# Patient Record
Sex: Female | Born: 1945 | Race: White | Hispanic: No | Smoking: Never smoker
Health system: Southern US, Community
[De-identification: ages and names within clinical notes are randomized; demographics above are authoritative.]

## PROBLEM LIST (undated history)

## (undated) DIAGNOSIS — E785 Hyperlipidemia, unspecified: Secondary | ICD-10-CM

## (undated) DIAGNOSIS — E119 Type 2 diabetes mellitus without complications: Secondary | ICD-10-CM

## (undated) DIAGNOSIS — C801 Malignant (primary) neoplasm, unspecified: Secondary | ICD-10-CM

## (undated) DIAGNOSIS — R0600 Dyspnea, unspecified: Secondary | ICD-10-CM

## (undated) DIAGNOSIS — I10 Essential (primary) hypertension: Secondary | ICD-10-CM

## (undated) DIAGNOSIS — M1990 Unspecified osteoarthritis, unspecified site: Secondary | ICD-10-CM

## (undated) DIAGNOSIS — G4733 Obstructive sleep apnea (adult) (pediatric): Secondary | ICD-10-CM

## (undated) HISTORY — PX: OTHER SURGICAL HISTORY: SHX169

## (undated) HISTORY — PX: ABDOMINAL HYSTERECTOMY: SHX81

## (undated) HISTORY — DX: Essential (primary) hypertension: I10

## (undated) HISTORY — DX: Dyspnea, unspecified: R06.00

## (undated) HISTORY — DX: Obstructive sleep apnea (adult) (pediatric): G47.33

## (undated) HISTORY — DX: Hyperlipidemia, unspecified: E78.5

## (undated) HISTORY — PX: BACK SURGERY: SHX140

## (undated) HISTORY — PX: COLONOSCOPY: SHX174

## (undated) HISTORY — DX: Type 2 diabetes mellitus without complications: E11.9

## (undated) HISTORY — PX: APPENDECTOMY: SHX54

---

## 1999-07-16 ENCOUNTER — Encounter: Admission: RE | Admit: 1999-07-16 | Discharge: 1999-07-16 | Payer: Self-pay | Admitting: General Surgery

## 1999-07-16 ENCOUNTER — Encounter: Payer: Self-pay | Admitting: General Surgery

## 1999-08-21 ENCOUNTER — Emergency Department (HOSPITAL_COMMUNITY): Admission: EM | Admit: 1999-08-21 | Discharge: 1999-08-21 | Payer: Self-pay | Admitting: Emergency Medicine

## 1999-08-21 ENCOUNTER — Encounter: Payer: Self-pay | Admitting: Emergency Medicine

## 2000-05-31 ENCOUNTER — Emergency Department (HOSPITAL_COMMUNITY): Admission: EM | Admit: 2000-05-31 | Discharge: 2000-05-31 | Payer: Self-pay | Admitting: Emergency Medicine

## 2000-05-31 ENCOUNTER — Encounter: Payer: Self-pay | Admitting: Emergency Medicine

## 2000-06-16 ENCOUNTER — Other Ambulatory Visit: Admission: RE | Admit: 2000-06-16 | Discharge: 2000-06-16 | Payer: Self-pay | Admitting: Obstetrics and Gynecology

## 2000-07-24 ENCOUNTER — Encounter: Payer: Self-pay | Admitting: Obstetrics and Gynecology

## 2000-07-24 ENCOUNTER — Encounter: Admission: RE | Admit: 2000-07-24 | Discharge: 2000-07-24 | Payer: Self-pay | Admitting: Obstetrics and Gynecology

## 2000-08-13 ENCOUNTER — Encounter: Admission: RE | Admit: 2000-08-13 | Discharge: 2000-08-13 | Payer: Self-pay | Admitting: Obstetrics and Gynecology

## 2000-08-13 ENCOUNTER — Encounter: Payer: Self-pay | Admitting: Obstetrics and Gynecology

## 2001-06-29 ENCOUNTER — Other Ambulatory Visit: Admission: RE | Admit: 2001-06-29 | Discharge: 2001-06-29 | Payer: Self-pay | Admitting: Obstetrics and Gynecology

## 2001-07-26 ENCOUNTER — Encounter: Payer: Self-pay | Admitting: Obstetrics and Gynecology

## 2001-07-26 ENCOUNTER — Encounter: Admission: RE | Admit: 2001-07-26 | Discharge: 2001-07-26 | Payer: Self-pay | Admitting: Obstetrics and Gynecology

## 2002-07-29 ENCOUNTER — Encounter: Admission: RE | Admit: 2002-07-29 | Discharge: 2002-07-29 | Payer: Self-pay | Admitting: Obstetrics and Gynecology

## 2002-07-29 ENCOUNTER — Encounter: Payer: Self-pay | Admitting: Obstetrics and Gynecology

## 2003-09-20 ENCOUNTER — Encounter: Admission: RE | Admit: 2003-09-20 | Discharge: 2003-09-20 | Payer: Self-pay | Admitting: General Surgery

## 2003-09-26 ENCOUNTER — Encounter: Admission: RE | Admit: 2003-09-26 | Discharge: 2003-09-26 | Payer: Self-pay | Admitting: General Surgery

## 2004-10-15 ENCOUNTER — Encounter: Admission: RE | Admit: 2004-10-15 | Discharge: 2004-10-15 | Payer: Self-pay | Admitting: Family Medicine

## 2005-12-26 ENCOUNTER — Encounter: Admission: RE | Admit: 2005-12-26 | Discharge: 2005-12-26 | Payer: Self-pay | Admitting: Obstetrics and Gynecology

## 2006-12-29 ENCOUNTER — Encounter: Admission: RE | Admit: 2006-12-29 | Discharge: 2006-12-29 | Payer: Self-pay | Admitting: Obstetrics and Gynecology

## 2007-06-04 ENCOUNTER — Encounter: Admission: RE | Admit: 2007-06-04 | Discharge: 2007-06-04 | Payer: Self-pay | Admitting: Family Medicine

## 2007-12-30 ENCOUNTER — Encounter: Admission: RE | Admit: 2007-12-30 | Discharge: 2007-12-30 | Payer: Self-pay | Admitting: Family Medicine

## 2009-01-01 ENCOUNTER — Encounter: Admission: RE | Admit: 2009-01-01 | Discharge: 2009-01-01 | Payer: Self-pay | Admitting: Internal Medicine

## 2009-10-17 ENCOUNTER — Emergency Department (HOSPITAL_COMMUNITY): Admission: EM | Admit: 2009-10-17 | Discharge: 2009-10-18 | Payer: Self-pay | Admitting: Emergency Medicine

## 2010-02-11 ENCOUNTER — Encounter: Admission: RE | Admit: 2010-02-11 | Discharge: 2010-02-11 | Payer: Self-pay | Admitting: Internal Medicine

## 2010-04-27 ENCOUNTER — Encounter: Payer: Self-pay | Admitting: Internal Medicine

## 2011-01-16 ENCOUNTER — Other Ambulatory Visit: Payer: Self-pay | Admitting: Obstetrics and Gynecology

## 2011-01-16 DIAGNOSIS — Z1231 Encounter for screening mammogram for malignant neoplasm of breast: Secondary | ICD-10-CM

## 2011-02-14 ENCOUNTER — Ambulatory Visit: Payer: Self-pay

## 2011-02-19 ENCOUNTER — Ambulatory Visit
Admission: RE | Admit: 2011-02-19 | Discharge: 2011-02-19 | Disposition: A | Discharge status: Home / Self Care | Payer: Medicare Other | Source: Ambulatory Visit | Attending: Obstetrics and Gynecology | Admitting: Obstetrics and Gynecology

## 2011-02-19 DIAGNOSIS — Z1231 Encounter for screening mammogram for malignant neoplasm of breast: Secondary | ICD-10-CM

## 2011-02-28 ENCOUNTER — Other Ambulatory Visit: Payer: Self-pay | Admitting: Obstetrics and Gynecology

## 2011-02-28 DIAGNOSIS — R928 Other abnormal and inconclusive findings on diagnostic imaging of breast: Secondary | ICD-10-CM

## 2011-03-19 ENCOUNTER — Ambulatory Visit
Admission: RE | Admit: 2011-03-19 | Discharge: 2011-03-19 | Disposition: A | Discharge status: Home / Self Care | Payer: Medicare Other | Source: Ambulatory Visit | Attending: Obstetrics and Gynecology | Admitting: Obstetrics and Gynecology

## 2011-03-19 DIAGNOSIS — R928 Other abnormal and inconclusive findings on diagnostic imaging of breast: Secondary | ICD-10-CM

## 2011-09-11 ENCOUNTER — Ambulatory Visit: Payer: Self-pay

## 2012-02-10 ENCOUNTER — Other Ambulatory Visit: Payer: Self-pay | Admitting: Gastroenterology

## 2012-02-13 ENCOUNTER — Other Ambulatory Visit: Payer: Self-pay | Admitting: Obstetrics and Gynecology

## 2012-02-13 DIAGNOSIS — Z1231 Encounter for screening mammogram for malignant neoplasm of breast: Secondary | ICD-10-CM

## 2012-03-11 ENCOUNTER — Ambulatory Visit: Payer: Self-pay | Admitting: Gastroenterology

## 2012-03-23 ENCOUNTER — Ambulatory Visit
Admission: RE | Admit: 2012-03-23 | Discharge: 2012-03-23 | Disposition: A | Discharge status: Home / Self Care | Payer: Medicare Other | Source: Ambulatory Visit | Attending: Obstetrics and Gynecology | Admitting: Obstetrics and Gynecology

## 2012-03-23 DIAGNOSIS — Z1231 Encounter for screening mammogram for malignant neoplasm of breast: Secondary | ICD-10-CM

## 2013-03-15 ENCOUNTER — Other Ambulatory Visit: Payer: Self-pay

## 2013-03-15 DIAGNOSIS — Z1231 Encounter for screening mammogram for malignant neoplasm of breast: Secondary | ICD-10-CM

## 2013-04-19 ENCOUNTER — Ambulatory Visit
Admission: RE | Admit: 2013-04-19 | Discharge: 2013-04-19 | Disposition: A | Discharge status: Home / Self Care | Payer: Medicare Other | Source: Ambulatory Visit

## 2013-04-19 DIAGNOSIS — Z1231 Encounter for screening mammogram for malignant neoplasm of breast: Secondary | ICD-10-CM

## 2013-10-08 DIAGNOSIS — I10 Essential (primary) hypertension: Secondary | ICD-10-CM | POA: Insufficient documentation

## 2013-10-08 DIAGNOSIS — E1122 Type 2 diabetes mellitus with diabetic chronic kidney disease: Secondary | ICD-10-CM | POA: Insufficient documentation

## 2014-03-21 ENCOUNTER — Other Ambulatory Visit: Payer: Self-pay

## 2014-03-21 DIAGNOSIS — Z1231 Encounter for screening mammogram for malignant neoplasm of breast: Secondary | ICD-10-CM

## 2014-04-24 ENCOUNTER — Ambulatory Visit
Admission: RE | Admit: 2014-04-24 | Discharge: 2014-04-24 | Disposition: A | Discharge status: Home / Self Care | Payer: Medicare Other | Source: Ambulatory Visit

## 2014-04-24 DIAGNOSIS — Z1231 Encounter for screening mammogram for malignant neoplasm of breast: Secondary | ICD-10-CM

## 2015-05-02 ENCOUNTER — Other Ambulatory Visit: Payer: Self-pay

## 2015-05-02 DIAGNOSIS — Z1231 Encounter for screening mammogram for malignant neoplasm of breast: Secondary | ICD-10-CM

## 2015-05-18 ENCOUNTER — Ambulatory Visit: Payer: Medicare Other

## 2015-07-23 ENCOUNTER — Ambulatory Visit: Payer: Medicare Other

## 2015-07-25 ENCOUNTER — Ambulatory Visit
Admission: RE | Admit: 2015-07-25 | Discharge: 2015-07-25 | Disposition: A | Discharge status: Home / Self Care | Payer: Medicare HMO | Source: Ambulatory Visit

## 2015-07-25 DIAGNOSIS — Z1231 Encounter for screening mammogram for malignant neoplasm of breast: Secondary | ICD-10-CM

## 2016-07-10 ENCOUNTER — Other Ambulatory Visit: Payer: Self-pay | Admitting: Internal Medicine

## 2016-07-10 DIAGNOSIS — Z1231 Encounter for screening mammogram for malignant neoplasm of breast: Secondary | ICD-10-CM

## 2016-07-31 ENCOUNTER — Ambulatory Visit
Admission: RE | Admit: 2016-07-31 | Discharge: 2016-07-31 | Disposition: A | Discharge status: Home / Self Care | Payer: Medicare HMO | Source: Ambulatory Visit | Attending: Internal Medicine | Admitting: Internal Medicine

## 2016-07-31 DIAGNOSIS — Z1231 Encounter for screening mammogram for malignant neoplasm of breast: Secondary | ICD-10-CM

## 2016-12-10 ENCOUNTER — Other Ambulatory Visit: Payer: Self-pay | Admitting: Internal Medicine

## 2016-12-10 ENCOUNTER — Other Ambulatory Visit (HOSPITAL_COMMUNITY): Payer: Self-pay | Admitting: Internal Medicine

## 2016-12-10 DIAGNOSIS — R1011 Right upper quadrant pain: Secondary | ICD-10-CM

## 2016-12-18 ENCOUNTER — Ambulatory Visit (HOSPITAL_COMMUNITY)
Admission: RE | Admit: 2016-12-18 | Discharge: 2016-12-18 | Disposition: A | Discharge status: Home / Self Care | Payer: Medicare HMO | Source: Ambulatory Visit | Attending: Internal Medicine | Admitting: Internal Medicine

## 2016-12-18 DIAGNOSIS — R1011 Right upper quadrant pain: Secondary | ICD-10-CM

## 2017-02-11 ENCOUNTER — Ambulatory Visit: Payer: Medicare HMO | Admitting: Anesthesiology

## 2017-02-11 ENCOUNTER — Encounter
Admission: RE | Disposition: A | Discharge status: Home / Self Care | Payer: Self-pay | Source: Ambulatory Visit | Attending: Internal Medicine

## 2017-02-11 ENCOUNTER — Ambulatory Visit
Admission: RE | Admit: 2017-02-11 | Discharge: 2017-02-11 | Disposition: A | Discharge status: Home / Self Care | Payer: Medicare HMO | Source: Ambulatory Visit | Attending: Internal Medicine | Admitting: Internal Medicine

## 2017-02-11 ENCOUNTER — Encounter: Payer: Self-pay | Admitting: *Deleted

## 2017-02-11 DIAGNOSIS — Z7984 Long term (current) use of oral hypoglycemic drugs: Secondary | ICD-10-CM | POA: Insufficient documentation

## 2017-02-11 DIAGNOSIS — G4733 Obstructive sleep apnea (adult) (pediatric): Secondary | ICD-10-CM | POA: Diagnosis not present

## 2017-02-11 DIAGNOSIS — K449 Diaphragmatic hernia without obstruction or gangrene: Secondary | ICD-10-CM | POA: Diagnosis not present

## 2017-02-11 DIAGNOSIS — E785 Hyperlipidemia, unspecified: Secondary | ICD-10-CM | POA: Insufficient documentation

## 2017-02-11 DIAGNOSIS — Z6830 Body mass index (BMI) 30.0-30.9, adult: Secondary | ICD-10-CM | POA: Diagnosis not present

## 2017-02-11 DIAGNOSIS — K219 Gastro-esophageal reflux disease without esophagitis: Secondary | ICD-10-CM | POA: Insufficient documentation

## 2017-02-11 DIAGNOSIS — I10 Essential (primary) hypertension: Secondary | ICD-10-CM | POA: Diagnosis not present

## 2017-02-11 DIAGNOSIS — K640 First degree hemorrhoids: Secondary | ICD-10-CM | POA: Insufficient documentation

## 2017-02-11 DIAGNOSIS — K222 Esophageal obstruction: Secondary | ICD-10-CM | POA: Insufficient documentation

## 2017-02-11 DIAGNOSIS — E669 Obesity, unspecified: Secondary | ICD-10-CM | POA: Diagnosis not present

## 2017-02-11 DIAGNOSIS — Z1211 Encounter for screening for malignant neoplasm of colon: Secondary | ICD-10-CM | POA: Diagnosis not present

## 2017-02-11 DIAGNOSIS — R1031 Right lower quadrant pain: Secondary | ICD-10-CM | POA: Diagnosis not present

## 2017-02-11 DIAGNOSIS — Z885 Allergy status to narcotic agent status: Secondary | ICD-10-CM | POA: Diagnosis not present

## 2017-02-11 DIAGNOSIS — Z79899 Other long term (current) drug therapy: Secondary | ICD-10-CM | POA: Insufficient documentation

## 2017-02-11 DIAGNOSIS — M069 Rheumatoid arthritis, unspecified: Secondary | ICD-10-CM | POA: Insufficient documentation

## 2017-02-11 DIAGNOSIS — K5909 Other constipation: Secondary | ICD-10-CM | POA: Insufficient documentation

## 2017-02-11 DIAGNOSIS — Z86010 Personal history of colonic polyps: Secondary | ICD-10-CM | POA: Diagnosis not present

## 2017-02-11 DIAGNOSIS — Z882 Allergy status to sulfonamides status: Secondary | ICD-10-CM | POA: Diagnosis not present

## 2017-02-11 DIAGNOSIS — E119 Type 2 diabetes mellitus without complications: Secondary | ICD-10-CM | POA: Insufficient documentation

## 2017-02-11 DIAGNOSIS — Z85828 Personal history of other malignant neoplasm of skin: Secondary | ICD-10-CM | POA: Insufficient documentation

## 2017-02-11 DIAGNOSIS — Z881 Allergy status to other antibiotic agents status: Secondary | ICD-10-CM | POA: Insufficient documentation

## 2017-02-11 DIAGNOSIS — R0600 Dyspnea, unspecified: Secondary | ICD-10-CM | POA: Insufficient documentation

## 2017-02-11 DIAGNOSIS — Z888 Allergy status to other drugs, medicaments and biological substances status: Secondary | ICD-10-CM | POA: Insufficient documentation

## 2017-02-11 HISTORY — DX: Malignant (primary) neoplasm, unspecified: C80.1

## 2017-02-11 HISTORY — DX: Unspecified osteoarthritis, unspecified site: M19.90

## 2017-02-11 HISTORY — PX: ESOPHAGOGASTRODUODENOSCOPY (EGD) WITH PROPOFOL: SHX5813

## 2017-02-11 HISTORY — PX: COLONOSCOPY WITH PROPOFOL: SHX5780

## 2017-02-11 LAB — GLUCOSE, CAPILLARY: GLUCOSE-CAPILLARY: 194 mg/dL — AB (ref 65–99)

## 2017-02-11 SURGERY — ESOPHAGOGASTRODUODENOSCOPY (EGD) WITH PROPOFOL
Anesthesia: General

## 2017-02-11 MED ORDER — PROPOFOL 10 MG/ML IV BOLUS
INTRAVENOUS | Status: DC | PRN
  Administered 2017-02-11: 20 mg via INTRAVENOUS
  Administered 2017-02-11: 30 mg via INTRAVENOUS

## 2017-02-11 MED ORDER — FENTANYL CITRATE (PF) 100 MCG/2ML IJ SOLN
INTRAMUSCULAR | Status: AC
  Filled 2017-02-11: qty 2

## 2017-02-11 MED ORDER — SODIUM CHLORIDE 0.9 % IV SOLN
INTRAVENOUS | Status: DC
  Administered 2017-02-11: 07:00:00 via INTRAVENOUS

## 2017-02-11 MED ORDER — LIDOCAINE HCL 2 % EX GEL
CUTANEOUS | Status: AC
  Filled 2017-02-11: qty 5

## 2017-02-11 MED ORDER — PROPOFOL 500 MG/50ML IV EMUL
INTRAVENOUS | Status: AC
  Filled 2017-02-11: qty 50

## 2017-02-11 MED ORDER — FENTANYL CITRATE (PF) 100 MCG/2ML IJ SOLN
INTRAMUSCULAR | Status: DC | PRN
  Administered 2017-02-11 (×2): 50 ug via INTRAVENOUS

## 2017-02-11 MED ORDER — PROPOFOL 500 MG/50ML IV EMUL
INTRAVENOUS | Status: DC | PRN
  Administered 2017-02-11: 120 ug/kg/min via INTRAVENOUS

## 2017-02-11 NOTE — Op Note (Signed)
[facility] Gastroenterology Patient Name: Edris Schneck Procedure Date: 02/11/2017 7:37 AM MRN: [id number] Account #: [id number] Date of Birth: February 01, 1946 Admit Type: Outpatient Age: 71 Room: [facility] ENDO ROOM 4 Gender: Female Note Status: Finalized Procedure:            Upper GI endoscopy Indications:          Abdominal pain in the right lower quadrant, Esophageal                        reflux Providers:            Benay Pike. Alice Reichert MD, MD Referring MD:         Ocie Cornfield. Ouida Sills MD, MD (Referring MD) Medicines:            Propofol per Anesthesia Complications:        No immediate complications. Procedure:            Pre-Anesthesia Assessment:                       - The risks and benefits of the procedure and the                        sedation options and risks were discussed with the                        patient. All questions were answered and informed                        consent was obtained.                       - Patient identification and proposed procedure were                        verified prior to the procedure by the nurse. The                        procedure was verified in the procedure room.                       - ASA Grade Assessment: IV - A patient with severe                        systemic disease that is a constant threat to life.                       - After reviewing the risks and benefits, the patient                        was deemed in satisfactory condition to undergo the                        procedure.                       After obtaining informed consent, the endoscope was                        passed under direct vision. Throughout the procedure,  the patient's blood pressure, pulse, and oxygen                        saturations were monitored continuously. The Endoscope                        was introduced through the mouth, and advanced to the                        third part of duodenum.  The upper GI endoscopy was                        accomplished without difficulty. The patient tolerated                        the procedure well. Findings:      A non-obstructing Schatzki ring (acquired) was found in the lower third       of the esophagus.      The exam was otherwise without abnormality.      A small hiatal hernia was present.      Localized mildly erythematous mucosa without bleeding was found in the       gastric antrum.      The examined duodenum was normal. Impression:           - Non-obstructing Schatzki ring.                       - The examination was otherwise normal.                       - Small hiatal hernia.                       - Erythematous mucosa in the antrum.                       - Normal examined duodenum.                       - No specimens collected. Recommendation:       - Patient has a contact number available for                        emergencies. The signs and symptoms of potential                        delayed complications were discussed with the patient.                        Return to normal activities tomorrow. Written discharge                        instructions were provided to the patient.                       - Resume previous diet.                       - Continue present medications.                       - See the other procedure note  for documentation of                        additional recommendations. Procedure Code(s):    --- Professional ---                       [phone], Esophagogastroduodenoscopy, flexible, transoral;                        diagnostic, including collection of specimen(s) by                        brushing or washing, when performed (separate procedure) Diagnosis Code(s):    --- Professional ---                       K22.2, Esophageal obstruction                       K44.9, Diaphragmatic hernia without obstruction or                        gangrene                       K31.89, Other diseases of  stomach and duodenum                       R10.31, Right lower quadrant pain                       K21.9, Gastro-esophageal reflux disease without                        esophagitis CPT copyright 2016 American Medical Association. All rights reserved. The codes documented in this report are preliminary and upon coder review may  be revised to meet current compliance requirements. Efrain Sella MD, MD 02/11/2017 8:31:19 AM This report has been signed electronically. Number of Addenda: 0 Note Initiated On: 02/11/2017 7:37 AM Scope Withdrawal Time: 0 hours 10 minutes 18 seconds  Total Procedure Duration: 0 hours 19 minutes 16 seconds       [facility]

## 2017-02-11 NOTE — Transfer of Care (Signed)
Immediate Anesthesia Transfer of Care Note  Patient: Sharon Mitchell  Procedure(s) Performed: ESOPHAGOGASTRODUODENOSCOPY (EGD) WITH PROPOFOL (N/A ) COLONOSCOPY WITH PROPOFOL (N/A )  Patient Location: PACU  Anesthesia Type:General  Level of Consciousness: awake  Airway & Oxygen Therapy: Patient Spontanous Breathing and Patient connected to nasal cannula oxygen  Post-op Assessment: Report given to RN and Post -op Vital signs reviewed and stable  Post vital signs: Reviewed  Last Vitals:  Vitals:   02/11/17 0656  BP: 134/81  Pulse: 95  Resp: 18  Temp: (!) 36 C  SpO2: 98%    Last Pain:  Vitals:   02/11/17 0656  TempSrc: Oral         Complications: No apparent anesthesia complications

## 2017-02-11 NOTE — H&P (Signed)
Outpatient short stay form Pre-procedure 02/11/2017 7:52 AM Teodoro K. Alice Reichert, M.D.  Primary Physician: Frazier Richards, M.D.  Reason for visit:  GERD, RLQ pain, constipation, personal hx of colon polyps.  History of present illness:  Patient is a 71 y/o female presenting for hx of GERD, mild and intermittent without dysphagia. Patient does not take antacids for her hb. Patient has also chronic constipation. Has personal hx of colon polyps, but last colonoscopy by Dr. Candace Cruise in 2013 was neg for polyps.  Patient has resolved the RLQ pain since stopping Metformin.   Current Facility-Administered Medications:  .  0.9 %  sodium chloride infusion, , Intravenous, Continuous, [place], Benay Pike, MD, Last Rate: 20 mL/hr at 02/11/17 1610  Medications Prior to Admission  Medication Sig Dispense Refill Last Dose  . amLODipine (NORVASC) 5 MG tablet Take 5 mg daily by mouth.   02/11/2017 at Unknown time  . Ascorbic Acid (VITAMIN C) 1000 MG tablet Take 1,000 mg daily by mouth.   Past Week at Unknown time  . azelastine (ASTELIN) 0.1 % nasal spray Place 1 spray 2 (two) times daily into both nostrils. Use in each nostril as directed   Past Week at Unknown time  . blood glucose meter kit and supplies KIT daily as needed by Does not apply route. Dispense based on patient and insurance preference. Use up to four times daily as directed. (FOR ICD-9 250.00, 250.01).     . calcium-vitamin D (OSCAL WITH D) 500-200 MG-UNIT tablet Take 1 tablet by mouth.   Past Week at Unknown time  . cyanocobalamin 1000 MCG tablet Take 1,000 mcg daily by mouth.   Past Week at Unknown time  . fexofenadine (ALLEGRA) 180 MG tablet Take 180 mg daily by mouth.   Past Week at Unknown time  . fluticasone (FLONASE) 50 MCG/ACT nasal spray Place daily into both nostrils.   Past Week at Unknown time  . folic acid (FOLVITE) 1 MG tablet Take 1 mg daily by mouth.   Past Week at Unknown time  . hydroxychloroquine (PLAQUENIL) 200 MG tablet Take daily  by mouth.     Marland Kitchen LANCETS MICRO THIN 33G MISC by Does not apply route.     Marland Kitchen lisinopril-hydrochlorothiazide (PRINZIDE,ZESTORETIC) 20-12.5 MG tablet Take 1 tablet daily by mouth.   02/11/2017 at Unknown time  . loratadine (CLARITIN) 10 MG tablet Take 10 mg daily by mouth.   Past Week at Unknown time  . metFORMIN (GLUCOPHAGE-XR) 500 MG 24 hr tablet Take 500 mg daily with breakfast by mouth.   Past Week at Unknown time  . Methotrexate, PF, 25 MG/0.5ML SOAJ Inject into the skin.     . Multiple Vitamin (MULTIVITAMIN) tablet Take 1 tablet daily by mouth.     . naproxen sodium (ALEVE) 220 MG tablet Take 220 mg by mouth.     . pyridOXINE (VITAMIN B-6) 50 MG tablet Take 50 mg daily by mouth.   Past Week at Unknown time  . tiZANidine (ZANAFLEX) 4 MG tablet Take 4 mg every 6 (six) hours as needed by mouth for muscle spasms.   Past Week at Unknown time     Allergies  Allergen Reactions  . Bactrim [Sulfamethoxazole-Trimethoprim]   . Hydrocodone   . Iodine   . Niaspan [Niacin Er] Swelling    Rash  . Statins Other (See Comments)    Muscle pain   . Sulfa Antibiotics      Past Medical History:  Diagnosis Date  . Arthritis    RA  .  Cancer ([place])    skin cancer  . Diabetes mellitus without complication ([place])   . Dyspnea    November 2009- low risk nuclear stress test, four minute, decrease exercise tolerance , echo cardiogram with mild diastolic dysfuction ,normal ,EF   . Hyperlipidemia   . Hypertension   . OSA (obstructive sleep apnea)     Review of systems:      Physical Exam  General appearance: alert, cooperative and appears stated age Resp: clear to auscultation bilaterally Cardio: regular rate and rhythm, S1, S2 normal, no murmur, click, rub or gallop GI: soft, non-tender; bowel sounds normal; no masses,  no organomegaly     Planned procedures:  1. EGD and colonoscopy. The patient understands the nature of the planned procedure, indications, risks, alternatives and potential  complications including but not limited to bleeding, infection, perforation, damage to internal organs and possible oversedation/side effects from anesthesia. The patient agrees and gives consent to proceed.  Please refer to procedure notes for findings, recommendations and patient disposition/instructions.    Teodoro K. Alice Reichert, M.D. Gastroenterology 02/11/2017  7:52 AM

## 2017-02-11 NOTE — Anesthesia Post-op Follow-up Note (Signed)
Anesthesia QCDR form completed.        

## 2017-02-11 NOTE — Anesthesia Postprocedure Evaluation (Signed)
Anesthesia Post Note  Patient: Sharon Mitchell  Procedure(s) Performed: ESOPHAGOGASTRODUODENOSCOPY (EGD) WITH PROPOFOL (N/A ) COLONOSCOPY WITH PROPOFOL (N/A )  Patient location during evaluation: PACU Anesthesia Type: General Level of consciousness: awake Pain management: pain level controlled Vital Signs Assessment: post-procedure vital signs reviewed and stable Respiratory status: spontaneous breathing Cardiovascular status: stable Anesthetic complications: no     Last Vitals:  Vitals:   02/11/17 0850 02/11/17 0900  BP: 97/73 116/68  Pulse: 71 74  Resp: 18 (!) 22  Temp:    SpO2: 96% 99%    Last Pain:  Vitals:   02/11/17 0820  TempSrc: Tympanic                 VAN STAVEREN,Larine Fielding

## 2017-02-11 NOTE — Anesthesia Preprocedure Evaluation (Signed)
Anesthesia Evaluation  Patient identified by MRN, date of birth, ID band Patient awake    Reviewed: Allergy & Precautions, NPO status , Patient's Chart, lab work & pertinent test results  Airway Mallampati: III       Dental  (+) Teeth Intact   Pulmonary sleep apnea ,    breath sounds clear to auscultation       Cardiovascular Exercise Tolerance: Good hypertension, Pt. on medications  Rhythm:Regular     Neuro/Psych negative neurological ROS     GI/Hepatic negative GI ROS, Neg liver ROS,   Endo/Other  diabetes, Type 2, Oral Hypoglycemic Agents  Renal/GU      Musculoskeletal   Abdominal (+) + obese,   Peds negative pediatric ROS (+)  Hematology   Anesthesia Other Findings   Reproductive/Obstetrics                             Anesthesia Physical Anesthesia Plan  ASA: III  Anesthesia Plan: General   Post-op Pain Management:    Induction: Intravenous  PONV Risk Score and Plan: 3 and 0  Airway Management Planned: Natural Airway and Nasal Cannula  Additional Equipment:   Intra-op Plan:   Post-operative Plan:   Informed Consent: I have reviewed the patients History and Physical, chart, labs and discussed the procedure including the risks, benefits and alternatives for the proposed anesthesia with the patient or authorized representative who has indicated his/her understanding and acceptance.     Plan Discussed with: Surgeon  Anesthesia Plan Comments:         Anesthesia Quick Evaluation

## 2017-02-11 NOTE — Op Note (Signed)
[facility] Gastroenterology Patient Name: Sharon Mitchell Procedure Date: 02/11/2017 7:36 AM MRN: [id number] Account #: [id number] Date of Birth: 08-24-1945 Admit Type: Outpatient Age: 71 Room: [facility] ENDO ROOM 4 Gender: Female Note Status: Finalized Procedure:            Colonoscopy Indications:          High risk colon cancer surveillance: Personal history                        of colonic polyps Providers:            Benay Pike. Alice Reichert MD, MD Referring MD:         Ocie Cornfield. Ouida Sills MD, MD (Referring MD) Medicines:            Propofol per Anesthesia Complications:        No immediate complications. Procedure:            Pre-Anesthesia Assessment:                       - The risks and benefits of the procedure and the                        sedation options and risks were discussed with the                        patient. All questions were answered and informed                        consent was obtained.                       - Patient identification and proposed procedure were                        verified prior to the procedure by the nurse. The                        procedure was verified in the procedure room.                       - ASA Grade Assessment: III - A patient with severe                        systemic disease.                       - After reviewing the risks and benefits, the patient                        was deemed in satisfactory condition to undergo the                        procedure.                       After obtaining informed consent, the colonoscope was                        passed under direct vision. Throughout the procedure,  the patient's blood pressure, pulse, and oxygen                        saturations were monitored continuously. The                        Colonoscope was introduced through the anus and                        advanced to the the cecum, identified by appendiceal         orifice and ileocecal valve. The colonoscopy was                        performed without difficulty. The colonoscopy was                        somewhat difficult due to restricted mobility of the                        colon. Successful completion of the procedure was aided                        by using manual pressure. The patient tolerated the                        procedure well. The quality of the bowel preparation                        was good. Findings:      The perianal and digital rectal examinations were normal. Pertinent       negatives include normal sphincter tone and no palpable rectal lesions.      The colon (entire examined portion) appeared normal.      Non-bleeding internal hemorrhoids were found during retroflexion. The       hemorrhoids were Grade I (internal hemorrhoids that do not prolapse). Impression:           - The entire examined colon is normal.                       - Non-bleeding internal hemorrhoids.                       - No specimens collected. Recommendation:       - Repeat colonoscopy in 10 years for screening purposes.                       - Return to GI office PRN.                       - Patient has a contact number available for                        emergencies. The signs and symptoms of potential                        delayed complications were discussed with the patient.                        Return to normal activities tomorrow. Written discharge  instructions were provided to the patient.                       - Resume previous diet.                       - Continue present medications.                       - The findings and recommendations were discussed with                        the patient's family. Procedure Code(s):    --- Professional ---                       M0867, Colorectal cancer screening; colonoscopy on                        individual at high risk Diagnosis Code(s):    --- Professional  ---                       Z86.010, Personal history of colonic polyps                       K64.0, First degree hemorrhoids CPT copyright 2016 American Medical Association. All rights reserved. The codes documented in this report are preliminary and upon coder review may  be revised to meet current compliance requirements. Efrain Sella MD, MD 02/11/2017 8:34:06 AM This report has been signed electronically. Number of Addenda: 0 Note Initiated On: 02/11/2017 7:36 AM      [facility]

## 2017-02-13 ENCOUNTER — Encounter: Payer: Self-pay | Admitting: Internal Medicine

## 2017-07-23 ENCOUNTER — Other Ambulatory Visit: Payer: Self-pay | Admitting: Internal Medicine

## 2017-07-23 DIAGNOSIS — Z1231 Encounter for screening mammogram for malignant neoplasm of breast: Secondary | ICD-10-CM

## 2017-08-05 ENCOUNTER — Other Ambulatory Visit (HOSPITAL_COMMUNITY): Payer: Self-pay | Admitting: Otolaryngology

## 2017-08-05 DIAGNOSIS — H9041 Sensorineural hearing loss, unilateral, right ear, with unrestricted hearing on the contralateral side: Secondary | ICD-10-CM

## 2017-08-05 DIAGNOSIS — IMO0001 Reserved for inherently not codable concepts without codable children: Secondary | ICD-10-CM

## 2017-08-10 ENCOUNTER — Ambulatory Visit (HOSPITAL_COMMUNITY): Admission: RE | Admit: 2017-08-10 | Payer: Medicare HMO | Source: Ambulatory Visit

## 2017-08-10 ENCOUNTER — Ambulatory Visit (HOSPITAL_COMMUNITY)
Admission: RE | Admit: 2017-08-10 | Discharge: 2017-08-10 | Disposition: A | Discharge status: Home / Self Care | Payer: Medicare HMO | Source: Ambulatory Visit | Attending: Otolaryngology | Admitting: Otolaryngology

## 2017-08-10 DIAGNOSIS — G319 Degenerative disease of nervous system, unspecified: Secondary | ICD-10-CM | POA: Diagnosis not present

## 2017-08-10 DIAGNOSIS — H9041 Sensorineural hearing loss, unilateral, right ear, with unrestricted hearing on the contralateral side: Secondary | ICD-10-CM | POA: Insufficient documentation

## 2017-08-10 DIAGNOSIS — IMO0001 Reserved for inherently not codable concepts without codable children: Secondary | ICD-10-CM

## 2017-08-10 LAB — POCT I-STAT CREATININE: Creatinine, Ser: 0.8 mg/dL (ref 0.44–1.00)

## 2017-08-10 MED ORDER — GADOBENATE DIMEGLUMINE 529 MG/ML IV SOLN
20.0000 mL | Freq: Once | INTRAVENOUS | Status: AC | PRN
  Administered 2017-08-10: 19 mL via INTRAVENOUS

## 2017-08-20 ENCOUNTER — Ambulatory Visit
Admission: RE | Admit: 2017-08-20 | Discharge: 2017-08-20 | Disposition: A | Discharge status: Home / Self Care | Payer: Medicare HMO | Source: Ambulatory Visit | Attending: Internal Medicine | Admitting: Internal Medicine

## 2017-08-20 DIAGNOSIS — Z1231 Encounter for screening mammogram for malignant neoplasm of breast: Secondary | ICD-10-CM

## 2017-08-21 ENCOUNTER — Other Ambulatory Visit: Payer: Self-pay | Admitting: Internal Medicine

## 2017-08-21 DIAGNOSIS — R928 Other abnormal and inconclusive findings on diagnostic imaging of breast: Secondary | ICD-10-CM

## 2017-08-26 ENCOUNTER — Ambulatory Visit
Admission: RE | Admit: 2017-08-26 | Discharge: 2017-08-26 | Disposition: A | Discharge status: Home / Self Care | Payer: Medicare HMO | Source: Ambulatory Visit | Attending: Internal Medicine | Admitting: Internal Medicine

## 2017-08-26 DIAGNOSIS — R928 Other abnormal and inconclusive findings on diagnostic imaging of breast: Secondary | ICD-10-CM

## 2018-04-12 DIAGNOSIS — E538 Deficiency of other specified B group vitamins: Secondary | ICD-10-CM | POA: Diagnosis not present

## 2018-05-24 DIAGNOSIS — Z79899 Other long term (current) drug therapy: Secondary | ICD-10-CM | POA: Diagnosis not present

## 2018-05-24 DIAGNOSIS — E538 Deficiency of other specified B group vitamins: Secondary | ICD-10-CM | POA: Diagnosis not present

## 2018-05-24 DIAGNOSIS — M0609 Rheumatoid arthritis without rheumatoid factor, multiple sites: Secondary | ICD-10-CM | POA: Diagnosis not present

## 2018-05-26 DIAGNOSIS — H9311 Tinnitus, right ear: Secondary | ICD-10-CM | POA: Diagnosis not present

## 2018-05-26 DIAGNOSIS — H903 Sensorineural hearing loss, bilateral: Secondary | ICD-10-CM | POA: Diagnosis not present

## 2018-05-26 DIAGNOSIS — H8101 Meniere's disease, right ear: Secondary | ICD-10-CM | POA: Diagnosis not present

## 2018-05-26 DIAGNOSIS — R42 Dizziness and giddiness: Secondary | ICD-10-CM | POA: Diagnosis not present

## 2018-05-31 DIAGNOSIS — M720 Palmar fascial fibromatosis [Dupuytren]: Secondary | ICD-10-CM | POA: Diagnosis not present

## 2018-05-31 DIAGNOSIS — M0609 Rheumatoid arthritis without rheumatoid factor, multiple sites: Secondary | ICD-10-CM | POA: Diagnosis not present

## 2018-05-31 DIAGNOSIS — M150 Primary generalized (osteo)arthritis: Secondary | ICD-10-CM | POA: Diagnosis not present

## 2018-06-03 DIAGNOSIS — E1122 Type 2 diabetes mellitus with diabetic chronic kidney disease: Secondary | ICD-10-CM | POA: Diagnosis not present

## 2018-06-03 DIAGNOSIS — E1169 Type 2 diabetes mellitus with other specified complication: Secondary | ICD-10-CM | POA: Diagnosis not present

## 2018-06-03 DIAGNOSIS — E785 Hyperlipidemia, unspecified: Secondary | ICD-10-CM | POA: Diagnosis not present

## 2018-06-03 DIAGNOSIS — I10 Essential (primary) hypertension: Secondary | ICD-10-CM | POA: Diagnosis not present

## 2018-06-03 DIAGNOSIS — N182 Chronic kidney disease, stage 2 (mild): Secondary | ICD-10-CM | POA: Diagnosis not present

## 2018-06-10 DIAGNOSIS — N182 Chronic kidney disease, stage 2 (mild): Secondary | ICD-10-CM | POA: Diagnosis not present

## 2018-06-10 DIAGNOSIS — E785 Hyperlipidemia, unspecified: Secondary | ICD-10-CM | POA: Diagnosis not present

## 2018-06-10 DIAGNOSIS — N644 Mastodynia: Secondary | ICD-10-CM | POA: Diagnosis not present

## 2018-06-10 DIAGNOSIS — I10 Essential (primary) hypertension: Secondary | ICD-10-CM | POA: Diagnosis not present

## 2018-06-10 DIAGNOSIS — E1122 Type 2 diabetes mellitus with diabetic chronic kidney disease: Secondary | ICD-10-CM | POA: Diagnosis not present

## 2018-06-10 DIAGNOSIS — E1169 Type 2 diabetes mellitus with other specified complication: Secondary | ICD-10-CM | POA: Diagnosis not present

## 2018-06-10 DIAGNOSIS — M0609 Rheumatoid arthritis without rheumatoid factor, multiple sites: Secondary | ICD-10-CM | POA: Diagnosis not present

## 2018-06-15 ENCOUNTER — Other Ambulatory Visit: Payer: Self-pay

## 2018-06-15 NOTE — Patient Outreach (Signed)
  [facility]) Care Management Chronic Special Needs Program  06/15/2018  Name: ANJOLIE MAJER DOB: 06/19/1945  MRN: [id number]  Ms. Linetta Regner is enrolled in a chronic special needs plan for Diabetes. Chronic Care Management Coordinator telephoned client to review health risk assessment and to develop individualized care plan.  Introduced the chronic care management program, importance of client participation, and taking their care plan to all provider appointments and inpatient facilities.  Reviewed the transition of care process and possible referral to community care management.  Subjective: Client states she tries to watch her diet but she has not been exercising much this winter.  States she would like information about [employer]  Goals Addressed            This Visit's Progress   . Client understands the importance of follow-up with providers by attending scheduled visits      . Client will report abillity to obtain Medications within 3 months       [employer] scientist (medical) will call you    . Client will use Assistive Devices as needed and verbalize understanding of device use      . Client will verbalize knowledge of self management of Hypertension as evidences by BP reading of 140/90 or less; or as defined by provider      . Diabetes Patient stated goal to get Silver Sne[employer] care (pt-stated)       Call [employer] at [phone] to enroll    . HEMOGLOBIN A1C < 7.0       Diabetes self management actions:  Glucose monitoring per provider recommendations  Perform Quality checks on blood meter  Eat Healthy  Check feet daily  Visit provider every 3-6 months as directed  Hbg A1C level every 3-6 months.  Eye Exam yearly    . Maintain timely refills of diabetic medication as prescribed within the year .      Marland Kitchen Obtain annual  Lipid Profile, LDL-C      . Obtain Annual Eye (retinal)  Exam       . Obtain Annual Foot Exam        . Obtain annual screen for micro albuminuria (urine) , nephropathy (kidney problems)      . Obtain Hemoglobin A1C at least 2 times per year      . Visit Primary Care Provider or Endocrinologist at least 2 times per year        Client is meeting diabetes self management goal of hemoglobin A1C of <7% with last reading 6.9%.  Client agreeable to referral to pharmacy for >8 medications and issues with cost last year  Plan:  Send successful outreach letter with a copy of their individualized care plan, Send individual care plan to provider and Send educational material-HTN, DM and [employer]  Chronic care management coordination will outreach in:  6 Months  Will refer client to:  Pharmacy   Peter Garter RN, [facility], [place] Management [phone]

## 2018-06-16 ENCOUNTER — Other Ambulatory Visit: Payer: Self-pay | Admitting: Pharmacist

## 2018-06-16 NOTE — Patient Outreach (Signed)
[facility]) Care Management  [place]   06/16/2018  Sharon Mitchell 01-18-1946 [id number]  Reason for referral: medication assistance, medication management  Referral source: [facility] RN with HTA C-SNP Current insurance: HTA vs [employer]?  PMHx includes but not limited to:  T2DM, HTN, HLD, CKD-II, rhinitis, RA  Outreach:  Successful telephone call with Ms. Feutz.  HIPAA identifiers verified.   Subjective:  Patient agreeable to review medications.  Patient denies issues with cost or adherence.     Objective: Lab Results  Component Value Date   CREATININE 0.80 08/10/2017    No results found for: HGBA1C  Lipid Panel  No results found for: CHOL, TRIG, HDL, CHOLHDL, VLDL, LDLCALC, LDLDIRECT  BP Readings from Last 3 Encounters:  02/11/17 116/68    Allergies  Allergen Reactions  . Bactrim [Sulfamethoxazole-Trimethoprim]   . Hydrocodone   . Iodine   . Niaspan [Niacin Er] Swelling    Rash  . Statins Other (See Comments)    Muscle pain   . Sulfa Antibiotics     Medications Reviewed Today    Reviewed by Belia Heman, RN (Registered Nurse) on 02/11/17 at [place] List Status: <None>  Medication Order Taking? Sig Documenting Provider Last Dose Status Informant  amLODipine (NORVASC) 5 MG tablet [id number] Yes Take 5 mg daily by mouth. [provider] 02/11/2017 Unknown time Active   Ascorbic Acid (VITAMIN C) 1000 MG tablet [id number] Yes Take 1,000 mg daily by mouth. [provider] Past Week Unknown time Active   azelastine (ASTELIN) 0.1 % nasal spray [id number] Yes Place 1 spray 2 (two) times daily into both nostrils. Use in each nostril as directed [provider] Past Week Unknown time Active   blood glucose meter kit and supplies KIT [id number] Yes daily as needed by Does not apply route. Dispense based on patient and insurance preference. Use up to four times daily as directed. (FOR ICD-9 250.00, 250.01). [provider]  Active   calcium-vitamin D (OSCAL WITH D) 500-200 MG-UNIT tablet [id number] Yes Take 1 tablet by mouth. [provider] Past Week Unknown time Active   cyanocobalamin 1000 MCG tablet [id number] Yes Take 1,000 mcg daily by mouth. [provider] Past Week Unknown time Active   fexofenadine (ALLEGRA) 180 MG tablet [id number] Yes Take 180 mg daily by mouth. [provider] Past Week Unknown time Active   fluticasone (FLONASE) 50 MCG/ACT nasal spray [id number] Yes Place daily into both nostrils. [provider] Past Week Unknown time Active   folic acid (FOLVITE) 1 MG tablet [id number] Yes Take 1 mg daily by mouth. [provider] Past Week Unknown time Active   hydroxychloroquine (PLAQUENIL) 200 MG tablet [id number] Yes Take daily by mouth. [provider]  Active   LANCETS MICRO THIN [place] [id number] Yes by Does not apply route. [provider]  Active   lisinopril-hydrochlorothiazide (PRINZIDE,ZESTORETIC) 20-12.5 MG tablet [id number] Yes Take 1 tablet daily by mouth. [provider] 02/11/2017 Unknown time Active   loratadine (CLARITIN) 10 MG tablet [id number] Yes Take 10 mg daily by mouth. [provider] Past Week Unknown time Active   metFORMIN (GLUCOPHAGE-XR) 500 MG 24 hr tablet [id number] Yes Take 500 mg daily with breakfast by mouth. [provider] Past Week Unknown time Active   Methotrexate, PF, 25 MG/0.5ML SOAJ [id number] Yes Inject into the skin. [provider]  Active   Multiple Vitamin (MULTIVITAMIN) tablet [id number] Yes Take 1 tablet daily by mouth. [provider]  Active   naproxen sodium (ALEVE) 220 MG tablet [id number] Yes Take 220 mg by mouth. [provider]  Active   pyridOXINE (VITAMIN B-6) 50 MG tablet [id number] Yes Take 50 mg daily by mouth. [provider] Past Week Unknown time Active   tiZANidine (ZANAFLEX) 4 MG tablet [id number] Yes Take 4 mg  every 6 (six) hours as needed by mouth for muscle spasms. [provider] Past Week Unknown time Active           Assessment:  Drugs sorted by system:  Cardiovascular: amlodipine, lisinopril-HCTZ  Pulmonary/Allergy: fluticasone NS, loratadine  Endocrine: dulaglutide, pioglitazone  Pain: naproxen  Vitamins/Minerals/Supplements: Vitamin C, Vitamin B-6, Vitamin B-12, calcium + Vitamin D, folic acid, MVI, turmeric  Miscellaneous: hydroxychloroquine, methotrexate  Medication Review Findings:  . Patient experiencing pain upon injection of dulaglutide.  We reviewed rotating sites, bringing medication to room temperature.  Patient started a lidocaine cream trial.  We also discussed possible trying a sample of another GLP-1 to see if less pain -must discuss this with provider.  Patient voiced understanding.     Medication Assistance Findings:  No medication assistance needs identified  Plan: Will close [facility] pharmacy case as no further medication needs identified at this time.  Am happy to assist in the future as needed.   Provided my contact information to patient if she would like to reach out to me in the future.   Ralene Bathe, PharmD, [place] [phone]

## 2018-06-22 DIAGNOSIS — M25512 Pain in left shoulder: Secondary | ICD-10-CM | POA: Diagnosis not present

## 2018-06-28 DIAGNOSIS — E539 Vitamin B deficiency, unspecified: Secondary | ICD-10-CM | POA: Diagnosis not present

## 2018-07-22 ENCOUNTER — Other Ambulatory Visit: Payer: Self-pay | Admitting: Internal Medicine

## 2018-07-22 DIAGNOSIS — N6320 Unspecified lump in the left breast, unspecified quadrant: Secondary | ICD-10-CM

## 2018-07-29 ENCOUNTER — Other Ambulatory Visit: Payer: Self-pay | Admitting: Pharmacist

## 2018-07-29 ENCOUNTER — Other Ambulatory Visit: Payer: Self-pay

## 2018-07-29 DIAGNOSIS — E539 Vitamin B deficiency, unspecified: Secondary | ICD-10-CM | POA: Diagnosis not present

## 2018-07-29 NOTE — Patient Outreach (Signed)
  [facility]) Care Management Chronic Special Needs Program    07/29/2018  Name: Sharon Mitchell, DOB: 1945-06-08  MRN: [id number]   Ms. Mardell Suttles is enrolled in a chronic special needs plan for Diabetes. Client called RNCM to let her know that her Trulicity was $998 and she thinks she is in the coverage gap. Instructed client that RNCM will message Ralene Bathe [facility] to contact client to see if she qualifies for any pharmacy assistance programs Notified Ralene Bathe [facility] to contact client Plan to contact client on next scheduled follow up call  Peter Garter RN, [name], [place] Management [phone]

## 2018-07-29 NOTE — Patient Outreach (Addendum)
[facility]) Care Management  [place]   07/29/2018  Sharon Mitchell 1946/04/05 [id number]  Reason for referral: Medication Assistance  Patient contacted HTA C-SNP CM RN Peter Garter regarding medication assistance with Trulicity.  Referral source: [place] Management RN with [employer] C-SNP Current insurance: [employer] C-SNP  PMHx includes but not limited to:  T2DM, HTN, HLD, CKD-II, rhinitis, RA  Outreach:  Successful telephone call with Sharon Mitchell.  HIPAA identifiers verified.   Subjective:  Patient reports she is still experiencing a lot of pain from administration of medication.  She has tried bringing it to room temperature before taking it but it has not helped.  She is now in the coverage gap and is having difficulty paying for it.  She has not discussed alternative medications yet with PCP.   Objective: Lab Results  Component Value Date   CREATININE 0.80 08/10/2017    No results found for: HGBA1C  Lipid Panel  No results found for: CHOL, TRIG, HDL, CHOLHDL, VLDL, LDLCALC, LDLDIRECT  BP Readings from Last 3 Encounters:  02/11/17 116/68    Allergies  Allergen Reactions  . Bactrim [Sulfamethoxazole-Trimethoprim]   . Hydrocodone   . Iodine   . Niaspan [Niacin Er] Swelling    Rash  . Statins Other (See Comments)    Muscle pain   . Sulfa Antibiotics     Medications Reviewed Today    Reviewed by Rudean Haskell, RPH (Pharmacist) on 06/16/18 at 1440  Med List Status: <None>  Medication Order Taking? Sig Documenting Provider Last Dose Status Informant  amLODipine (NORVASC) 5 MG tablet [id number] Yes Take 5 mg daily by mouth. [provider] Taking Active   Ascorbic Acid (VITAMIN C) 100 MG tablet [id number] Yes Take 100 mg by mouth daily.  [provider] Taking Active   blood glucose meter kit and supplies KIT [id number] Yes daily as needed by Does not apply route. Dispense based on patient and  insurance preference. Use up to four times daily as directed. (FOR ICD-9 250.00, 250.01). [provider] Taking Active   calcium-vitamin D (OSCAL WITH D) 500-200 MG-UNIT tablet [id number] Yes Take 1 tablet by mouth. [provider] Taking Active   Cyanocobalamin (VITAMIN B-12 IJ) [id number] Yes Inject 1,000 mcg as directed every 30 (thirty) days. [provider] Taking Active   Dulaglutide (TRULICITY) 4.74 QV/9.5GL SOPN [id number] Yes Inject 0.75 mg into the skin once a week. Tuesdays [provider] Taking Active   fluticasone (FLONASE) 50 MCG/ACT nasal spray [id number] Yes Place daily into both nostrils. [provider] Taking Active   folic acid (FOLVITE) 1 MG tablet [id number] Yes Take 2 mg by mouth daily.  [provider] Taking Active   hydroxychloroquine (PLAQUENIL) 200 MG tablet [id number] Yes Take 200 mg by mouth daily.  [provider] Taking Active   LANCETS MICRO THIN [place] [id number] Yes by Does not apply route. [provider] Taking Active   lisinopril-hydrochlorothiazide (PRINZIDE,ZESTORETIC) 20-12.5 MG tablet [id number] Yes Take 1 tablet daily by mouth. [provider] Taking Active   loratadine (CLARITIN) 10 MG tablet [id number] Yes Take 10 mg daily by mouth. [provider] Taking Active   Methotrexate, PF, 25 MG/0.5ML SOAJ [id number] Yes Inject 50 mg into the skin once a week. Tuesday [provider] Taking Active   Multiple Vitamin (MULTIVITAMIN) tablet [id number] Yes Take 1 tablet daily by mouth. [provider] Taking Active   naproxen sodium (ALEVE) 220 MG  tablet [id number] Yes Take 220 mg by mouth 2 (two) times daily as needed.  [provider] Taking Active   pioglitazone (ACTOS) 15 MG tablet [id number] Yes Take 15 mg by mouth daily. [provider] Taking Active   pyridOXINE (VITAMIN B-6) 100 MG tablet [id number] Yes Take 100 mg by mouth daily.  [provider] Taking Active   TURMERIC PO [id number] Yes Take 50 mg by mouth daily. [provider] Taking Active            Medication Assistance Findings:  Medication assistance needs identified. Trulicity  Patient may need to change to alternative GLP-1 agonist due to injection site pain from pen.  There is an alternative, Ozempic, which she could apply for via [employer] which recently waived the $1000 out-of-pocket expenditure due to COVID-19.    Extra Help:  Not eligible for Extra Help Low Income Subsidy based on reported income and assets  Patient Assistance Programs (PAPs): Trulicity made by [place] requirement met: Yes o Out-of-pocket prescription expenditure met:   Not Applicable - Patient has met application requirements to apply for this patient assistance program.   - Reviewed program requirements with patients.    Ozempic made by [place] requirement met: Yes o Out-of-pocket prescription expenditure met:   Not Applicable - Patient has met application requirements to apply for this patient assistance program.   - Reviewed program requirements with patients.    I reviewed both PAPs with patient.  She will contact Dr. Ouida Sills to discuss Trulicity and made decision to either continue OR change to alternative agent due to pain from injections.    I provided patient with contact information for [facility] as she has many questions regarding Medicare plans and supplements.    Plan: . Will follow-up in 1 week with patient unless I hear back beforehand.   Ralene Bathe, PharmD, [place] [phone]

## 2018-08-03 ENCOUNTER — Other Ambulatory Visit: Payer: Self-pay | Admitting: Pharmacist

## 2018-08-03 ENCOUNTER — Ambulatory Visit: Payer: Self-pay | Admitting: Pharmacist

## 2018-08-03 NOTE — Patient Outreach (Signed)
[facility]) Care Management  [place] 08/03/2018  BRUNELLA WILEMAN [birth year] [id number]  Reason for call: f/u GLP-1 agonist decision  Successful call with Ms. Neeley today.  Patient reports that PCP office was closed last Friday and she has not had a chance to call this week yet to discuss diabetes therapy.  She requests that I call her in 1 week.   Plan: F/u with patient next week regarding Trulicity vs substitution to start application process for patient assistance program.   Ralene Bathe, PharmD, [place] [phone]

## 2018-08-10 ENCOUNTER — Other Ambulatory Visit: Payer: Self-pay | Admitting: Pharmacist

## 2018-08-10 ENCOUNTER — Ambulatory Visit: Payer: Self-pay | Admitting: Pharmacist

## 2018-08-10 NOTE — Patient Outreach (Signed)
[facility]) Care Management  [place] 08/10/2018  CAYMAN BROGDEN [birth year] [id number]  Reason for call: f/u GLP-1 agonist decision  Per review of [facility], Dr. Ouida Sills has changed patient from Trulicity to Rybelsus (PO form of GLP-1 agonist).    Outreach:  Successful outreach call to Ms. Wurth today.  Patient reports she paid for the Rybelsus and took her first dose yesterday.  She states the copay was nearly $200.  We reviewed that Rybelsus 3mg  dose is not effective for glycemic control and only intended for therapy initiation.  Patient should expect increase in CBGs until she can titrate to next dose of medication in 30 days.  Patient voiced understanding.  She is agreeable to apply for [employer] patient assistance program for Rybelsus.    Plan: I will route patient assistance letter to [place] technician who will coordinate patient assistance program application process for medications listed above.  [facility] pharmacy technician will assist with obtaining all required documents from both patient and provider(s) and submit application(s) once completed.    Ralene Bathe, PharmD, [place] [phone]

## 2018-08-11 ENCOUNTER — Other Ambulatory Visit: Payer: Self-pay | Admitting: Pharmacy Technician

## 2018-08-11 NOTE — Patient Outreach (Signed)
[facility]) Care Management  08/11/2018  Sharon Mitchell [birth year] [id number]                          Medication Assistance Referral  Referral From: [place]  Medication/Company: Rybelsus / [employer]   Patient application portion: Education officer, museum portion: Faxed  to Dr. Ouida Sills   Follow up:  Will follow up with patient in 7-10 business days to confirm application(s) have been received.  Maud Deed Chana Bode [place] Certified Pharmacy Technician [place] Management Direct Dial:[phone]

## 2018-08-17 DIAGNOSIS — R140 Abdominal distension (gaseous): Secondary | ICD-10-CM | POA: Diagnosis not present

## 2018-08-17 DIAGNOSIS — N182 Chronic kidney disease, stage 2 (mild): Secondary | ICD-10-CM | POA: Diagnosis not present

## 2018-08-17 DIAGNOSIS — E785 Hyperlipidemia, unspecified: Secondary | ICD-10-CM | POA: Diagnosis not present

## 2018-08-17 DIAGNOSIS — M0609 Rheumatoid arthritis without rheumatoid factor, multiple sites: Secondary | ICD-10-CM | POA: Diagnosis not present

## 2018-08-17 DIAGNOSIS — R635 Abnormal weight gain: Secondary | ICD-10-CM | POA: Diagnosis not present

## 2018-08-17 DIAGNOSIS — E1122 Type 2 diabetes mellitus with diabetic chronic kidney disease: Secondary | ICD-10-CM | POA: Diagnosis not present

## 2018-08-17 DIAGNOSIS — E1169 Type 2 diabetes mellitus with other specified complication: Secondary | ICD-10-CM | POA: Diagnosis not present

## 2018-08-17 DIAGNOSIS — K5909 Other constipation: Secondary | ICD-10-CM | POA: Diagnosis not present

## 2018-08-24 ENCOUNTER — Ambulatory Visit
Admission: RE | Admit: 2018-08-24 | Discharge: 2018-08-24 | Disposition: A | Discharge status: Home / Self Care | Payer: HMO | Source: Ambulatory Visit | Attending: Internal Medicine | Admitting: Internal Medicine

## 2018-08-24 ENCOUNTER — Ambulatory Visit
Admission: RE | Admit: 2018-08-24 | Discharge: 2018-08-24 | Disposition: A | Discharge status: Home / Self Care | Payer: Medicare HMO | Source: Ambulatory Visit | Attending: Internal Medicine | Admitting: Internal Medicine

## 2018-08-24 ENCOUNTER — Other Ambulatory Visit: Payer: Self-pay

## 2018-08-24 ENCOUNTER — Other Ambulatory Visit: Payer: Self-pay | Admitting: Internal Medicine

## 2018-08-24 DIAGNOSIS — N6320 Unspecified lump in the left breast, unspecified quadrant: Secondary | ICD-10-CM

## 2018-08-24 DIAGNOSIS — N644 Mastodynia: Secondary | ICD-10-CM | POA: Diagnosis not present

## 2018-08-31 DIAGNOSIS — M720 Palmar fascial fibromatosis [Dupuytren]: Secondary | ICD-10-CM | POA: Diagnosis not present

## 2018-08-31 DIAGNOSIS — E538 Deficiency of other specified B group vitamins: Secondary | ICD-10-CM | POA: Diagnosis not present

## 2018-08-31 DIAGNOSIS — M8949 Other hypertrophic osteoarthropathy, multiple sites: Secondary | ICD-10-CM | POA: Diagnosis not present

## 2018-08-31 DIAGNOSIS — M0609 Rheumatoid arthritis without rheumatoid factor, multiple sites: Secondary | ICD-10-CM | POA: Diagnosis not present

## 2018-09-08 ENCOUNTER — Other Ambulatory Visit: Payer: Self-pay | Admitting: Pharmacy Technician

## 2018-09-08 NOTE — Patient Outreach (Signed)
[facility]) Care Management  09/08/2018  Sharon Mitchell [facility] 1946-01-15 [id number]    Unsuccessful call #1 placed to patient regarding patient assistance application(s) for Rybelsus' , HIPAA compliant voicemail left.   Follow up:  Will make 2nd call attempt in 2-3 business days if call has not been returned.  Maud Deed Chana Bode [place] Certified Pharmacy Technician [place] Management Direct Dial:[phone]

## 2018-09-15 DIAGNOSIS — D1801 Hemangioma of skin and subcutaneous tissue: Secondary | ICD-10-CM | POA: Diagnosis not present

## 2018-09-15 DIAGNOSIS — D2372 Other benign neoplasm of skin of left lower limb, including hip: Secondary | ICD-10-CM | POA: Diagnosis not present

## 2018-09-15 DIAGNOSIS — D225 Melanocytic nevi of trunk: Secondary | ICD-10-CM | POA: Diagnosis not present

## 2018-09-15 DIAGNOSIS — L821 Other seborrheic keratosis: Secondary | ICD-10-CM | POA: Diagnosis not present

## 2018-09-15 DIAGNOSIS — D235 Other benign neoplasm of skin of trunk: Secondary | ICD-10-CM | POA: Diagnosis not present

## 2018-09-15 DIAGNOSIS — D2261 Melanocytic nevi of right upper limb, including shoulder: Secondary | ICD-10-CM | POA: Diagnosis not present

## 2018-09-15 DIAGNOSIS — D692 Other nonthrombocytopenic purpura: Secondary | ICD-10-CM | POA: Diagnosis not present

## 2018-09-15 DIAGNOSIS — Z85820 Personal history of malignant melanoma of skin: Secondary | ICD-10-CM | POA: Diagnosis not present

## 2018-09-16 ENCOUNTER — Other Ambulatory Visit: Payer: Self-pay | Admitting: Pharmacy Technician

## 2018-09-16 ENCOUNTER — Other Ambulatory Visit: Payer: Self-pay | Admitting: Pharmacist

## 2018-09-16 NOTE — Patient Outreach (Signed)
[facility]) Care Management [place]  09/16/2018  Jacarra Bobak [facility] [birth year] [id number]  Patient no longer wishes to pursue patient assistance program application for Rybelsus as she believes her household income is over maximum required income set per [employer].   Will close [facility] pharmacy case.  Am happy to assist in the future as needed.   Ralene Bathe, PharmD, [place] [phone]

## 2018-09-16 NOTE — Patient Outreach (Signed)
[facility]) Care Management  09/16/2018  Sharon Mitchell [facility] 1945/10/09 [id number]    Successful call placed to patient regarding patient assistance application(s) for Rybelsus , HIPAA identifiers verified. Sharon Mitchell states that she received patient assistance application and believes that he and her spouses income is above requirement for program. Requested that if she decided to change her mind and wants to try to apply to please contact myself or [facility] RPh Colleen Summe.  Follow up:  Will route note to [place] for case closure.  Maud Deed Chana Bode [place] Certified Pharmacy Technician [place] Management Direct Dial:[phone]

## 2018-10-01 DIAGNOSIS — E538 Deficiency of other specified B group vitamins: Secondary | ICD-10-CM | POA: Diagnosis not present

## 2018-10-06 DIAGNOSIS — N182 Chronic kidney disease, stage 2 (mild): Secondary | ICD-10-CM | POA: Diagnosis not present

## 2018-10-06 DIAGNOSIS — E785 Hyperlipidemia, unspecified: Secondary | ICD-10-CM | POA: Diagnosis not present

## 2018-10-06 DIAGNOSIS — I10 Essential (primary) hypertension: Secondary | ICD-10-CM | POA: Diagnosis not present

## 2018-10-06 DIAGNOSIS — E1169 Type 2 diabetes mellitus with other specified complication: Secondary | ICD-10-CM | POA: Diagnosis not present

## 2018-10-06 DIAGNOSIS — E1122 Type 2 diabetes mellitus with diabetic chronic kidney disease: Secondary | ICD-10-CM | POA: Diagnosis not present

## 2018-10-13 DIAGNOSIS — T466X5A Adverse effect of antihyperlipidemic and antiarteriosclerotic drugs, initial encounter: Secondary | ICD-10-CM | POA: Diagnosis not present

## 2018-10-13 DIAGNOSIS — E785 Hyperlipidemia, unspecified: Secondary | ICD-10-CM | POA: Diagnosis not present

## 2018-10-13 DIAGNOSIS — N182 Chronic kidney disease, stage 2 (mild): Secondary | ICD-10-CM | POA: Diagnosis not present

## 2018-10-13 DIAGNOSIS — E1122 Type 2 diabetes mellitus with diabetic chronic kidney disease: Secondary | ICD-10-CM | POA: Diagnosis not present

## 2018-10-13 DIAGNOSIS — I10 Essential (primary) hypertension: Secondary | ICD-10-CM | POA: Diagnosis not present

## 2018-10-13 DIAGNOSIS — M0609 Rheumatoid arthritis without rheumatoid factor, multiple sites: Secondary | ICD-10-CM | POA: Diagnosis not present

## 2018-10-13 DIAGNOSIS — E1169 Type 2 diabetes mellitus with other specified complication: Secondary | ICD-10-CM | POA: Diagnosis not present

## 2018-10-13 DIAGNOSIS — M791 Myalgia, unspecified site: Secondary | ICD-10-CM | POA: Diagnosis not present

## 2018-10-27 ENCOUNTER — Other Ambulatory Visit: Payer: Self-pay | Admitting: Pharmacist

## 2018-10-27 NOTE — Patient Outreach (Signed)
[facility]) Care Management  [place] 10/27/2018  Nery Kalisz [facility] 07-Jan-1946 [id number]  SUPD (Statin Use in Patients with Diabetes) Quality Metric: -Patient on HTA C-SNP plan for diabetes and is on the list for not having active order for statin.  -Notes per EMR that patient has allergy listed to statins.   Successful call to patient today.  Patient reports she has tried several statins the past (cannot recall names) and that she did not tolerate any due to severe muscle aches and even a hospitalization due to itching and swelling.  Patient not candidate for re-trial of medication at this time due to severe reactions.   Ralene Bathe, PharmD, [place] [phone]

## 2018-11-03 DIAGNOSIS — E538 Deficiency of other specified B group vitamins: Secondary | ICD-10-CM | POA: Diagnosis not present

## 2018-11-23 DIAGNOSIS — H903 Sensorineural hearing loss, bilateral: Secondary | ICD-10-CM | POA: Diagnosis not present

## 2018-11-23 DIAGNOSIS — H8101 Meniere's disease, right ear: Secondary | ICD-10-CM | POA: Diagnosis not present

## 2018-11-29 DIAGNOSIS — M8949 Other hypertrophic osteoarthropathy, multiple sites: Secondary | ICD-10-CM | POA: Diagnosis not present

## 2018-11-29 DIAGNOSIS — M0609 Rheumatoid arthritis without rheumatoid factor, multiple sites: Secondary | ICD-10-CM | POA: Diagnosis not present

## 2018-11-29 DIAGNOSIS — M720 Palmar fascial fibromatosis [Dupuytren]: Secondary | ICD-10-CM | POA: Diagnosis not present

## 2018-11-30 DIAGNOSIS — H2513 Age-related nuclear cataract, bilateral: Secondary | ICD-10-CM | POA: Diagnosis not present

## 2018-11-30 DIAGNOSIS — H5203 Hypermetropia, bilateral: Secondary | ICD-10-CM | POA: Diagnosis not present

## 2018-11-30 DIAGNOSIS — E119 Type 2 diabetes mellitus without complications: Secondary | ICD-10-CM | POA: Diagnosis not present

## 2018-12-06 DIAGNOSIS — M720 Palmar fascial fibromatosis [Dupuytren]: Secondary | ICD-10-CM | POA: Diagnosis not present

## 2018-12-06 DIAGNOSIS — M79672 Pain in left foot: Secondary | ICD-10-CM | POA: Diagnosis not present

## 2018-12-06 DIAGNOSIS — M0609 Rheumatoid arthritis without rheumatoid factor, multiple sites: Secondary | ICD-10-CM | POA: Diagnosis not present

## 2018-12-06 DIAGNOSIS — E538 Deficiency of other specified B group vitamins: Secondary | ICD-10-CM | POA: Diagnosis not present

## 2018-12-06 DIAGNOSIS — M8949 Other hypertrophic osteoarthropathy, multiple sites: Secondary | ICD-10-CM | POA: Diagnosis not present

## 2018-12-06 DIAGNOSIS — Z79899 Other long term (current) drug therapy: Secondary | ICD-10-CM | POA: Diagnosis not present

## 2018-12-06 DIAGNOSIS — E539 Vitamin B deficiency, unspecified: Secondary | ICD-10-CM | POA: Diagnosis not present

## 2018-12-15 ENCOUNTER — Ambulatory Visit: Payer: Self-pay

## 2018-12-20 DIAGNOSIS — M79672 Pain in left foot: Secondary | ICD-10-CM | POA: Diagnosis not present

## 2018-12-20 DIAGNOSIS — M205X2 Other deformities of toe(s) (acquired), left foot: Secondary | ICD-10-CM | POA: Diagnosis not present

## 2018-12-20 DIAGNOSIS — E119 Type 2 diabetes mellitus without complications: Secondary | ICD-10-CM | POA: Diagnosis not present

## 2018-12-23 ENCOUNTER — Other Ambulatory Visit: Payer: Self-pay

## 2018-12-23 NOTE — Patient Outreach (Signed)
[facility]) Care Management Chronic Special Needs Program  12/23/2018  Name: Sharon Mitchell DOB: 01/08/1946  MRN: [id number]  Ms. Sharon Mitchell is enrolled in a chronic special needs plan for Diabetes. Reviewed and updated care plan.  Subjective: Client states that her diabetes has been doing good.  States that her last Hemoglobin A1C was 6.8% in July.  States she is now taking Rybelsus instead of the Trulicity.  States she does not qualify  for pharmacy assistance due to income is over limit.  States it is expensive but she is able to afford at this time.  States she tries to watch her portion sizes of CHO.   States she has done some online classes through the [employer] but she has not been walking as much due to her arthritis.  Goals Addressed            This Visit's Progress   . Client understands the importance of follow-up with providers by attending scheduled visits   On track   . COMPLETED: Client will report abillity to obtain Medications within 3 months       [employer] scientist (medical) referral completed    . Client will use Assistive Devices as needed and verbalize understanding of device use   On track   . Client will verbalize knowledge of self management of Hypertension as evidences by BP reading of 140/90 or less; or as defined by provider   On track   . COMPLETED: Diabetes Patient stated goal to get Silver Sneakers (pt-stated)       Enrolled in  Silver Sneakers     . HEMOGLOBIN A1C < 7.0       Diabetes self management actions:  Glucose monitoring per provider recommendations  Eat Healthy  Check feet daily  Visit provider every 3-6 months as directed  Hbg A1C level every 3-6 months.  Eye Exam yearly    . Maintain timely refills of diabetic medication as prescribed within the year .   On track   . COMPLETED: Obtain annual  Lipid Profile, LDL-C       Completed 10/06/2018    . COMPLETED: Obtain Annual Eye (retinal)   Exam        Completed in August 2020    . COMPLETED: Obtain Annual Foot Exam       Completed 12/20/2018    . COMPLETED: Obtain annual screen for micro albuminuria (urine) , nephropathy (kidney problems)       Completed 10/07/2018    . COMPLETED: Obtain Hemoglobin A1C at least 2 times per year       Completed  06/03/2018, 10/06/2018    . COMPLETED: Visit Primary Care Provider or Endocrinologist at least 2 times per year        Completed  06/03/2018, 10/13/2018     Client is meeting diabetes self management goal of hemoglobin A1C of <7% with last reading 6.8% Pharmacy has completed referral and client does not qualify for assistance due to income limits. Reinforced to follow a low CHO diet and to watch her portion sizes Encouraged to get regular exercise and to continue to do online classes that are easy on her joints  Reviewed number for 24-hour nurse Line Plan:  Send successful outreach letter with a copy of their individualized care plan and Send individual care plan to provider  Chronic care management coordinator will outreach in:  4-6 Months    Peter Garter RN, [facility], CDE Chronic Care Management Coordinator [facility] Care Management [phone]

## 2019-01-06 DIAGNOSIS — E538 Deficiency of other specified B group vitamins: Secondary | ICD-10-CM | POA: Diagnosis not present

## 2019-01-25 ENCOUNTER — Other Ambulatory Visit: Payer: HMO

## 2019-02-09 DIAGNOSIS — L03032 Cellulitis of left toe: Secondary | ICD-10-CM | POA: Diagnosis not present

## 2019-02-09 DIAGNOSIS — E1122 Type 2 diabetes mellitus with diabetic chronic kidney disease: Secondary | ICD-10-CM | POA: Diagnosis not present

## 2019-02-09 DIAGNOSIS — E538 Deficiency of other specified B group vitamins: Secondary | ICD-10-CM | POA: Diagnosis not present

## 2019-02-09 DIAGNOSIS — E785 Hyperlipidemia, unspecified: Secondary | ICD-10-CM | POA: Diagnosis not present

## 2019-02-09 DIAGNOSIS — N182 Chronic kidney disease, stage 2 (mild): Secondary | ICD-10-CM | POA: Diagnosis not present

## 2019-02-09 DIAGNOSIS — I10 Essential (primary) hypertension: Secondary | ICD-10-CM | POA: Diagnosis not present

## 2019-02-09 DIAGNOSIS — E1169 Type 2 diabetes mellitus with other specified complication: Secondary | ICD-10-CM | POA: Diagnosis not present

## 2019-02-16 DIAGNOSIS — E1122 Type 2 diabetes mellitus with diabetic chronic kidney disease: Secondary | ICD-10-CM | POA: Diagnosis not present

## 2019-02-16 DIAGNOSIS — Z0000 Encounter for general adult medical examination without abnormal findings: Secondary | ICD-10-CM | POA: Diagnosis not present

## 2019-02-16 DIAGNOSIS — E1169 Type 2 diabetes mellitus with other specified complication: Secondary | ICD-10-CM | POA: Diagnosis not present

## 2019-02-16 DIAGNOSIS — M0609 Rheumatoid arthritis without rheumatoid factor, multiple sites: Secondary | ICD-10-CM | POA: Diagnosis not present

## 2019-02-16 DIAGNOSIS — E785 Hyperlipidemia, unspecified: Secondary | ICD-10-CM | POA: Diagnosis not present

## 2019-02-16 DIAGNOSIS — T466X5A Adverse effect of antihyperlipidemic and antiarteriosclerotic drugs, initial encounter: Secondary | ICD-10-CM | POA: Diagnosis not present

## 2019-02-16 DIAGNOSIS — Z79899 Other long term (current) drug therapy: Secondary | ICD-10-CM | POA: Diagnosis not present

## 2019-02-16 DIAGNOSIS — M791 Myalgia, unspecified site: Secondary | ICD-10-CM | POA: Diagnosis not present

## 2019-02-16 DIAGNOSIS — N182 Chronic kidney disease, stage 2 (mild): Secondary | ICD-10-CM | POA: Diagnosis not present

## 2019-02-16 DIAGNOSIS — I10 Essential (primary) hypertension: Secondary | ICD-10-CM | POA: Diagnosis not present

## 2019-02-25 ENCOUNTER — Ambulatory Visit
Admission: RE | Admit: 2019-02-25 | Discharge: 2019-02-25 | Disposition: A | Discharge status: Home / Self Care | Payer: HMO | Source: Ambulatory Visit | Attending: Internal Medicine | Admitting: Internal Medicine

## 2019-02-25 ENCOUNTER — Other Ambulatory Visit: Payer: Self-pay

## 2019-02-25 ENCOUNTER — Other Ambulatory Visit: Payer: Self-pay | Admitting: Internal Medicine

## 2019-02-25 DIAGNOSIS — N6002 Solitary cyst of left breast: Secondary | ICD-10-CM | POA: Diagnosis not present

## 2019-02-25 DIAGNOSIS — N6320 Unspecified lump in the left breast, unspecified quadrant: Secondary | ICD-10-CM

## 2019-03-01 DIAGNOSIS — M25561 Pain in right knee: Secondary | ICD-10-CM | POA: Diagnosis not present

## 2019-03-01 DIAGNOSIS — M1711 Unilateral primary osteoarthritis, right knee: Secondary | ICD-10-CM | POA: Diagnosis not present

## 2019-03-14 DIAGNOSIS — E538 Deficiency of other specified B group vitamins: Secondary | ICD-10-CM | POA: Diagnosis not present

## 2019-03-14 DIAGNOSIS — E539 Vitamin B deficiency, unspecified: Secondary | ICD-10-CM | POA: Diagnosis not present

## 2019-03-22 DIAGNOSIS — M7051 Other bursitis of knee, right knee: Secondary | ICD-10-CM | POA: Diagnosis not present

## 2019-03-22 DIAGNOSIS — M25561 Pain in right knee: Secondary | ICD-10-CM | POA: Diagnosis not present

## 2019-03-22 DIAGNOSIS — M1711 Unilateral primary osteoarthritis, right knee: Secondary | ICD-10-CM | POA: Diagnosis not present

## 2019-03-28 DIAGNOSIS — M25561 Pain in right knee: Secondary | ICD-10-CM | POA: Diagnosis not present

## 2019-04-09 DIAGNOSIS — M25561 Pain in right knee: Secondary | ICD-10-CM | POA: Diagnosis not present

## 2019-04-14 DIAGNOSIS — E539 Vitamin B deficiency, unspecified: Secondary | ICD-10-CM | POA: Diagnosis not present

## 2019-04-14 DIAGNOSIS — E538 Deficiency of other specified B group vitamins: Secondary | ICD-10-CM | POA: Diagnosis not present

## 2019-04-19 DIAGNOSIS — M1711 Unilateral primary osteoarthritis, right knee: Secondary | ICD-10-CM | POA: Diagnosis not present

## 2019-04-19 DIAGNOSIS — M25561 Pain in right knee: Secondary | ICD-10-CM | POA: Diagnosis not present

## 2019-05-16 DIAGNOSIS — E538 Deficiency of other specified B group vitamins: Secondary | ICD-10-CM | POA: Diagnosis not present

## 2019-05-16 DIAGNOSIS — R1011 Right upper quadrant pain: Secondary | ICD-10-CM | POA: Diagnosis not present

## 2019-05-16 DIAGNOSIS — E539 Vitamin B deficiency, unspecified: Secondary | ICD-10-CM | POA: Diagnosis not present

## 2019-05-24 DIAGNOSIS — H6121 Impacted cerumen, right ear: Secondary | ICD-10-CM | POA: Diagnosis not present

## 2019-05-24 DIAGNOSIS — H8101 Meniere's disease, right ear: Secondary | ICD-10-CM | POA: Diagnosis not present

## 2019-05-24 DIAGNOSIS — H903 Sensorineural hearing loss, bilateral: Secondary | ICD-10-CM | POA: Diagnosis not present

## 2019-05-26 ENCOUNTER — Other Ambulatory Visit: Payer: Self-pay

## 2019-05-26 NOTE — Patient Outreach (Addendum)
  [facility]) Care Management Chronic Special Needs Program  05/26/2019  Name: Sharon Mitchell DOB: July 20, 1945  MRN: [id number]  Ms. Yasheka Halleran is enrolled in a chronic special needs plan for Diabetes.  Client called for scheduled call States she is unable to talk at this time.  States her husband passed away last week and she is still dealing with the shock and is still making finial arrangements.  States she has her pastor and church family that have been helping her RNCM expressed her sympathy and advised she could call if she feels she needs some counseling  from [place] work Scientist, product/process development to outreach from [employer] in one month  Plan for outreach call in one month Chronic care management coordinator will attempt outreach in one month.   Peter Garter RN, [name], CDE Chronic Care Management Coordinator [place] [facility] Care Management [phone]

## 2019-05-31 DIAGNOSIS — Z79899 Other long term (current) drug therapy: Secondary | ICD-10-CM | POA: Diagnosis not present

## 2019-05-31 DIAGNOSIS — M0609 Rheumatoid arthritis without rheumatoid factor, multiple sites: Secondary | ICD-10-CM | POA: Diagnosis not present

## 2019-06-06 DIAGNOSIS — M8949 Other hypertrophic osteoarthropathy, multiple sites: Secondary | ICD-10-CM | POA: Diagnosis not present

## 2019-06-06 DIAGNOSIS — Z79899 Other long term (current) drug therapy: Secondary | ICD-10-CM | POA: Diagnosis not present

## 2019-06-06 DIAGNOSIS — M0609 Rheumatoid arthritis without rheumatoid factor, multiple sites: Secondary | ICD-10-CM | POA: Diagnosis not present

## 2019-06-06 DIAGNOSIS — G8929 Other chronic pain: Secondary | ICD-10-CM | POA: Diagnosis not present

## 2019-06-06 DIAGNOSIS — M720 Palmar fascial fibromatosis [Dupuytren]: Secondary | ICD-10-CM | POA: Diagnosis not present

## 2019-06-06 DIAGNOSIS — M545 Low back pain: Secondary | ICD-10-CM | POA: Diagnosis not present

## 2019-06-09 DIAGNOSIS — I10 Essential (primary) hypertension: Secondary | ICD-10-CM | POA: Diagnosis not present

## 2019-06-09 DIAGNOSIS — E1169 Type 2 diabetes mellitus with other specified complication: Secondary | ICD-10-CM | POA: Diagnosis not present

## 2019-06-09 DIAGNOSIS — E785 Hyperlipidemia, unspecified: Secondary | ICD-10-CM | POA: Diagnosis not present

## 2019-06-09 DIAGNOSIS — E1122 Type 2 diabetes mellitus with diabetic chronic kidney disease: Secondary | ICD-10-CM | POA: Diagnosis not present

## 2019-06-09 DIAGNOSIS — N182 Chronic kidney disease, stage 2 (mild): Secondary | ICD-10-CM | POA: Diagnosis not present

## 2019-06-16 DIAGNOSIS — N182 Chronic kidney disease, stage 2 (mild): Secondary | ICD-10-CM | POA: Diagnosis not present

## 2019-06-16 DIAGNOSIS — E785 Hyperlipidemia, unspecified: Secondary | ICD-10-CM | POA: Diagnosis not present

## 2019-06-16 DIAGNOSIS — E1122 Type 2 diabetes mellitus with diabetic chronic kidney disease: Secondary | ICD-10-CM | POA: Diagnosis not present

## 2019-06-16 DIAGNOSIS — I10 Essential (primary) hypertension: Secondary | ICD-10-CM | POA: Diagnosis not present

## 2019-06-16 DIAGNOSIS — M791 Myalgia, unspecified site: Secondary | ICD-10-CM | POA: Diagnosis not present

## 2019-06-16 DIAGNOSIS — T466X5A Adverse effect of antihyperlipidemic and antiarteriosclerotic drugs, initial encounter: Secondary | ICD-10-CM | POA: Diagnosis not present

## 2019-06-16 DIAGNOSIS — E1169 Type 2 diabetes mellitus with other specified complication: Secondary | ICD-10-CM | POA: Diagnosis not present

## 2019-06-22 DIAGNOSIS — E538 Deficiency of other specified B group vitamins: Secondary | ICD-10-CM | POA: Diagnosis not present

## 2019-06-23 ENCOUNTER — Ambulatory Visit: Payer: Self-pay

## 2019-06-30 ENCOUNTER — Other Ambulatory Visit: Payer: Self-pay

## 2019-06-30 NOTE — Patient Outreach (Signed)
  [facility]) Care Management Chronic Special Needs Program  06/30/2019  Name: Sharon Mitchell DOB: 10-19-1945  MRN: [id number]  Ms. Sharon Mitchell is enrolled in a chronic special needs plan for Diabetes. Client called with no answer No answer and HIPAA compliant message left. 1st attempt Plan for 2rd outreach call in one week Chronic care management coordinator will attempt outreach in one week.   Peter Garter RN, [name], CDE Chronic Care Management Coordinator [place] [facility] Care Management [phone]

## 2019-07-05 ENCOUNTER — Other Ambulatory Visit: Payer: Self-pay

## 2019-07-05 NOTE — Patient Outreach (Signed)
[facility]) Care Management Chronic Special Needs Program  07/05/2019  Name: Sharon Mitchell DOB: 08-Jan-1946  MRN: [id number]  Sharon Mitchell is enrolled in a chronic special needs plan for Diabetes. Chronic Care Management Coordinator telephoned client to review health risk assessment and to develop individualized care plan.  Introduced the chronic care management program, importance of client participation, and taking their care plan to all provider appointments and inpatient facilities.  Reviewed the transition of care process and possible referral to community care management.  Subjective: Client states that she is recovering from the shock of her husbands death.  States that she has very good support from her church and some family members.  States she plans to go to a grief support group at her church and declines social work referral at this time.  States she saw her doctor earlier this month and her Hemoglobin A1C was very good.  States that she only checks her blood sugars a few times a month or more if she thinks they are higher.  States they range around 130-150 if they are higher.  Denies any low blood sugars.  States she has been trying to eat healthier and watches her portions.  States she has been trying to walk a few times a week for exercise.  States her arthritis is under control at this time. States she can afford her medications at this time.  States she has had both of her COVID shots  Goals Addressed            This Visit's Progress   . Client understands the importance of follow-up with providers by attending scheduled visits   On track    Plan to keep scheduled appointments with providers    . Client will use Assistive Devices as needed and verbalize understanding of device use   On track    Reports using glucometer with no issues Goal completed 07/05/19    . Client will verbalize knowledge of self management of Hypertension as evidences  by BP reading of 140/90 or less; or as defined by provider   On track    Reports B/P usually less than 140/90 when checked Signs and symptoms of heart failure reviewed Advised to notify doctor for symptoms Call 911 for severe symptoms Weigh daily and record weighs Follow a low salt diet  Sent EMMI-High Blood Pressure(Hypertension): What you can do     . Diabetes Patient stated goal to walk 30-40 minutes 3 times a week for the next 12 months (pt-stated)       Reviewed benefits of regular exercise to blood sugar and B/P control Reviewed recommendations to exercise 150 minutes a week including 2 sessions of resistance training Sent EMMI: Type 2 Diabetes: Food and Exercise    . HEMOGLOBIN A1C < 7.0       Last Hemoglobin A1C 6.3% 06/09/19 Reviewed fasting blood sugar goals of 80-130 and less than 180 1 1/2-2 hours after meals Reinforced to follow a low carbohydrate low salt diet and to watch portion sizes Reviewed use and possible side effects of diabetes medications  Reviewed diabetes action plan in [place] calendar    . Maintain timely refills of diabetic medication as prescribed within the year .   On track    Maintaining timely refills of medications per dispense report It is important to get your medications refilled on time    . Obtain annual  Lipid Profile, LDL-C   On track  Last completed 06/09/19 LDL 99 The goal for LDL is less than 70 mg/dL as you are at high risk for complications Try to avoid saturated fats, trans-fats and eat more fiber    . Obtain Annual Eye (retinal)  Exam    On track    Last completed in 11/30/18 Plan to have a dilated eye exam every year    . Obtain Annual Foot Exam   On track    Last completed 12/20/2018 Check your skin and feet every day for cuts, bruises, redness, blisters, or sores. Report to provider any problems with your feet Schedule a foot exam with your health care provider once every year    . Obtain annual screen for micro  albuminuria (urine) , nephropathy (kidney problems)   On track    Last completed 06/09/19 It is important for your doctor to check your urine for protein at least every year    . Obtain Hemoglobin A1C at least 2 times per year   On track    Last completed 06/09/19 It is important to have your Hemoglobin A1C checked every 6 months if you are at goal and every 3 months if you are not at goal    . Visit Primary Care Provider or Endocrinologist at least 2 times per year    On track    Primary care provider 06/09/19 Annual Wellness Visit 06/09/19     Client is meeting diabetes self management goal of hemoglobin A1C of <7% with last reading of 6.3% Declines social work referral for grief counseling at this time.  Instructed to notify RNCM if she would like to speak with social worker later Plan to change to Tier 1 from Tier 2 as she can afford her medications and Hemoglobin A1C well controlled  Reviewed number for 24-hour nurse Line Reviewed COVID 19 precautions  Plan:  Send successful outreach letter with a copy of their individualized care plan, Send individual care plan to provider and Send educational material EMMI-High Blood Pressure(Hypertension): What you can do, Type 2 diabetes: Food and Exercise  Chronic care management coordination will outreach in:  12 months per tier level     [place], [name], [place] Management [phone]

## 2019-07-25 DIAGNOSIS — E539 Vitamin B deficiency, unspecified: Secondary | ICD-10-CM | POA: Diagnosis not present

## 2019-07-25 DIAGNOSIS — E538 Deficiency of other specified B group vitamins: Secondary | ICD-10-CM | POA: Diagnosis not present

## 2019-08-01 DIAGNOSIS — M79605 Pain in left leg: Secondary | ICD-10-CM | POA: Diagnosis not present

## 2019-08-01 DIAGNOSIS — M25572 Pain in left ankle and joints of left foot: Secondary | ICD-10-CM | POA: Diagnosis not present

## 2019-08-08 ENCOUNTER — Ambulatory Visit
Admission: EM | Admit: 2019-08-08 | Discharge: 2019-08-08 | Disposition: A | Discharge status: Home / Self Care | Payer: HMO | Attending: Emergency Medicine | Admitting: Emergency Medicine

## 2019-08-08 DIAGNOSIS — L0231 Cutaneous abscess of buttock: Secondary | ICD-10-CM

## 2019-08-08 MED ORDER — DOXYCYCLINE HYCLATE 100 MG PO CAPS: 100.0000 mg | ORAL_CAPSULE | Freq: Two times a day (BID) | ORAL | 0 refills | Status: AC

## 2019-08-08 NOTE — ED Triage Notes (Signed)
Pt c/o abscess to lt crease of panty line x5 days. States red, tender, warm to touch, and now small drainage. States unable to see PCP until Thursday.

## 2019-08-08 NOTE — ED Provider Notes (Signed)
[name]    CSN: [id number] Arrival date & time: 08/08/19  0957      History   Chief Complaint Chief Complaint  Patient presents with  . Abscess    HPI Sharon Mitchell is a 74 y.o. female with history of skin cancer, diabetes, hypertension presenting for vulvovaginal abscess.  States she noticed it about 5 days ago, though the pain and swelling has worsened over the last few days.  States she did notice some small drainage the other day when she tried popping it.  Patient denies history of abscesses.  No change in urination or bowel habit, fever.   Past Medical History:  Diagnosis Date  . Arthritis    RA  . Cancer ([place])    skin cancer  . Diabetes mellitus without complication ([place])   . Dyspnea    November 2009- low risk nuclear stress test, four minute, decrease exercise tolerance , echo cardiogram with mild diastolic dysfuction ,normal ,EF   . Hyperlipidemia   . Hypertension   . OSA (obstructive sleep apnea)     Patient Active Problem List   Diagnosis Date Noted  . Diabetes mellitus with stage 2 chronic kidney disease ([place]) 10/08/2013  . HTN (hypertension), benign 10/08/2013    Past Surgical History:  Procedure Laterality Date  . ABDOMINAL HYSTERECTOMY    . APPENDECTOMY    . arthroscopic rotator cuff repair    . BACK SURGERY    . bladder tack    . COLONOSCOPY    . COLONOSCOPY WITH PROPOFOL N/A 02/11/2017   Procedure: COLONOSCOPY WITH PROPOFOL;  Surgeon: Toledo, Benay Pike, MD;  Location: [facility];  Service: Gastroenterology;  Laterality: N/A;  . ESOPHAGOGASTRODUODENOSCOPY (EGD) WITH PROPOFOL N/A 02/11/2017   Procedure: ESOPHAGOGASTRODUODENOSCOPY (EGD) WITH PROPOFOL;  Surgeon: Toledo, Benay Pike, MD;  Location: [facility];  Service: Gastroenterology;  Laterality: N/A;  . melanoma removal      OB History   No obstetric history on file.      Home Medications    Prior to Admission medications   Medication Sig Start Date End  Date Taking? Authorizing Provider  amLODipine (NORVASC) 5 MG tablet Take 5 mg daily by mouth.    [provider]  Ascorbic Acid (VITAMIN C) 100 MG tablet Take 100 mg by mouth daily.     [provider]  blood glucose meter kit and supplies KIT daily as needed by Does not apply route. Dispense based on patient and insurance preference. Use up to four times daily as directed. (FOR ICD-9 250.00, 250.01).    [provider]  calcium-vitamin D (OSCAL WITH D) 500-200 MG-UNIT tablet Take 1 tablet by mouth.    [provider]  Cyanocobalamin (VITAMIN B-12 IJ) Inject 1,000 mcg as directed every 30 (thirty) days.    [provider]  doxycycline (VIBRAMYCIN) 100 MG capsule Take 1 capsule (100 mg total) by mouth 2 (two) times daily for 7 days. 08/08/19 08/15/19  Hall-Potvin, [place], PA-C  fluticasone (FLONASE) 50 MCG/ACT nasal spray Place daily into both nostrils.    [provider]  folic acid (FOLVITE) 1 MG tablet Take 2 mg by mouth daily.     [provider]  hydroxychloroquine (PLAQUENIL) 200 MG tablet Take 200 mg by mouth daily.     [provider]  LANCETS MICRO THIN 33G MISC by Does not apply route.    [provider]  lisinopril-hydrochlorothiazide (PRINZIDE,ZESTORETIC) 20-12.5 MG tablet Take 1 tablet daily by mouth.  [provider]  loratadine (CLARITIN) 10 MG tablet Take 10 mg daily by mouth.    [provider]  Methotrexate, PF, 25 MG/0.5ML SOAJ Inject 50 mg into the skin once a week. Tuesday    [provider]  Multiple Vitamin (MULTIVITAMIN) tablet Take 1 tablet daily by mouth.    [provider]  naproxen sodium (ALEVE) 220 MG tablet Take 220 mg by mouth 2 (two) times daily as needed.     [provider]  pioglitazone (ACTOS) 15 MG tablet Take 15 mg by mouth daily.    [provider]  pyridOXINE (VITAMIN B-6) 100 MG tablet Take 100 mg by mouth daily.      [provider]  RYBELSUS 3 MG TABS  09/02/18   [provider]  TURMERIC PO Take 50 mg by mouth daily.    [provider]    Family History Family History  Problem Relation Age of Onset  . Uterine cancer Mother   . Colon polyps Father   . CAD Father   . Parkinsonism Father   . Colon cancer Maternal Grandmother     Social History Social History   Tobacco Use  . Smoking status: Never Smoker  . Smokeless tobacco: Never Used  Substance Use Topics  . Alcohol use: No  . Drug use: No     Allergies   Bactrim [sulfamethoxazole-trimethoprim], Hydrocodone, Iodine, Niaspan [niacin er], Statins, and Sulfa antibiotics   Review of Systems As per HPI   Physical Exam Triage Vital Signs ED Triage Vitals  Enc Vitals Group     BP      Pulse      Resp      Temp      Temp src      SpO2      Weight      Height      Head Circumference      Peak Flow      Pain Score      Pain Loc      Pain Edu?      Excl. in [place]?    No data found.  Updated Vital Signs BP 139/79 (BP Location: Left Arm)   Pulse 90   Temp 98.1 F (36.7 C) (Oral)   Resp 16   SpO2 96%   Visual Acuity Right Eye Distance:   Left Eye Distance:   Bilateral Distance:    Right Eye Near:   Left Eye Near:    Bilateral Near:     Physical Exam Constitutional:      General: She is not in acute distress. HENT:     Head: Normocephalic and atraumatic.  Eyes:     General: No scleral icterus.    Pupils: Pupils are equal, round, and reactive to light.  Cardiovascular:     Rate and Rhythm: Normal rate.  Pulmonary:     Effort: Pulmonary effort is normal.  Genitourinary:   Skin:    Coloration: Skin is not jaundiced or pale.  Neurological:     Mental Status: She is alert and oriented to person, place, and time.      UC Treatments / Results  Labs (all labs ordered are listed, but only abnormal results are displayed) Labs Reviewed - No data to display  EKG   Radiology No  results found.  Procedures Incision and Drainage  Date/Time: 08/08/2019 5:22 PM Performed by: Quincy Sheehan, PA-C Authorized by: Quincy Sheehan, PA-C   Consent:    Consent obtained:  Verbal   Consent given by:  Patient   Risks discussed:  Bleeding, incomplete drainage, pain and damage to other organs   Alternatives discussed:  No treatment Universal protocol:    Patient identity confirmed:  Verbally with patient Location:    Type:  Abscess   Size:  2cm   Location:  Anogenital   Anogenital location:  Perineum Pre-procedure details:    Skin preparation:  Betadine Anesthesia (see MAR for exact dosages):    Anesthesia method:  Local infiltration   Local anesthetic:  Lidocaine 2% w/o epi Procedure type:    Complexity:  Complex Procedure details:    Incision types:  Single straight   Incision depth:  Subcutaneous   Scalpel blade:  11   Wound management:  Probed and deloculated, irrigated with saline and extensive cleaning   Drainage:  Purulent and bloody   Drainage amount:  Moderate   Packing materials:  None Post-procedure details:    Patient tolerance of procedure:  Tolerated well, no immediate complications   (including critical care time)  Medications Ordered in UC Medications - No data to display  Initial Impression / Assessment and Plan / UC Course  I have reviewed the triage vital signs and the nursing notes.  Pertinent labs & imaging results that were available during my care of the patient were reviewed by me and considered in my medical decision making (see chart for details).     Patient febrile, nontoxic.  I&D performed which patient tolerated well.  Will follow with course of doxycycline given history of diabetes and location of open wound.  Return precautions discussed, patient verbalized understanding and is agreeable to plan. Final Clinical Impressions(s) / UC Diagnoses   Final diagnoses:  Abscess, gluteal, left     Discharge  Instructions     Keep area(s) clean and dry. Apply hot compress / towel for 5-10 minutes 3-5 times daily. Take antibiotic as prescribed with food - important to complete course. Return for worsening pain, redness, swelling, discharge, fever.  Helpful prevention tips: Keep nails short to avoid secondary skin infections. Use new, clean razors when shaving. Avoid antiperspirants - look for deodorants without aluminum. Avoid wearing underwire bras as this can irritate the area further.     ED Prescriptions    Medication Sig Dispense Auth. Provider   doxycycline (VIBRAMYCIN) 100 MG capsule Take 1 capsule (100 mg total) by mouth 2 (two) times daily for 7 days. 14 capsule Hall-Potvin, [place], PA-C     PDMP not reviewed this encounter.   Hall-Potvin, [place], [place] 08/08/19 1723

## 2019-08-08 NOTE — Discharge Instructions (Signed)
Keep area(s) clean and dry. °Apply hot compress / towel for 5-10 minutes 3-5 times daily. °Take antibiotic as prescribed with food - important to complete course. °Return for worsening pain, redness, swelling, discharge, fever. ° °Helpful prevention tips: °Keep nails short to avoid secondary skin infections. °Use new, clean razors when shaving. °Avoid antiperspirants - look for deodorants without aluminum. °Avoid wearing underwire bras as this can irritate the area further.  °

## 2019-08-15 ENCOUNTER — Ambulatory Visit
Admission: RE | Admit: 2019-08-15 | Discharge: 2019-08-15 | Disposition: A | Discharge status: Home / Self Care | Payer: HMO | Source: Ambulatory Visit | Attending: Internal Medicine | Admitting: Internal Medicine

## 2019-08-15 ENCOUNTER — Other Ambulatory Visit: Payer: Self-pay

## 2019-08-15 DIAGNOSIS — N6001 Solitary cyst of right breast: Secondary | ICD-10-CM | POA: Diagnosis not present

## 2019-08-15 DIAGNOSIS — N6320 Unspecified lump in the left breast, unspecified quadrant: Secondary | ICD-10-CM

## 2019-08-15 DIAGNOSIS — R921 Mammographic calcification found on diagnostic imaging of breast: Secondary | ICD-10-CM | POA: Diagnosis not present

## 2019-08-17 DIAGNOSIS — L02415 Cutaneous abscess of right lower limb: Secondary | ICD-10-CM | POA: Diagnosis not present

## 2019-08-25 DIAGNOSIS — E538 Deficiency of other specified B group vitamins: Secondary | ICD-10-CM | POA: Diagnosis not present

## 2019-08-25 DIAGNOSIS — E539 Vitamin B deficiency, unspecified: Secondary | ICD-10-CM | POA: Diagnosis not present

## 2019-08-29 ENCOUNTER — Other Ambulatory Visit: Payer: HMO

## 2019-09-07 DIAGNOSIS — Z79899 Other long term (current) drug therapy: Secondary | ICD-10-CM | POA: Diagnosis not present

## 2019-09-07 DIAGNOSIS — M0609 Rheumatoid arthritis without rheumatoid factor, multiple sites: Secondary | ICD-10-CM | POA: Diagnosis not present

## 2019-09-21 DIAGNOSIS — D1801 Hemangioma of skin and subcutaneous tissue: Secondary | ICD-10-CM | POA: Diagnosis not present

## 2019-09-21 DIAGNOSIS — Z85820 Personal history of malignant melanoma of skin: Secondary | ICD-10-CM | POA: Diagnosis not present

## 2019-09-21 DIAGNOSIS — D225 Melanocytic nevi of trunk: Secondary | ICD-10-CM | POA: Diagnosis not present

## 2019-09-21 DIAGNOSIS — L821 Other seborrheic keratosis: Secondary | ICD-10-CM | POA: Diagnosis not present

## 2019-09-21 DIAGNOSIS — L814 Other melanin hyperpigmentation: Secondary | ICD-10-CM | POA: Diagnosis not present

## 2019-09-21 DIAGNOSIS — M7130 Other bursal cyst, unspecified site: Secondary | ICD-10-CM | POA: Diagnosis not present

## 2019-09-21 DIAGNOSIS — D2372 Other benign neoplasm of skin of left lower limb, including hip: Secondary | ICD-10-CM | POA: Diagnosis not present

## 2019-09-21 DIAGNOSIS — M720 Palmar fascial fibromatosis [Dupuytren]: Secondary | ICD-10-CM | POA: Diagnosis not present

## 2019-09-25 ENCOUNTER — Other Ambulatory Visit: Payer: Self-pay

## 2019-09-25 ENCOUNTER — Ambulatory Visit
Admission: EM | Admit: 2019-09-25 | Discharge: 2019-09-25 | Disposition: A | Discharge status: Home / Self Care | Payer: HMO | Attending: Emergency Medicine | Admitting: Emergency Medicine

## 2019-09-25 ENCOUNTER — Encounter: Payer: Self-pay | Admitting: Emergency Medicine

## 2019-09-25 DIAGNOSIS — N3001 Acute cystitis with hematuria: Secondary | ICD-10-CM | POA: Insufficient documentation

## 2019-09-25 LAB — POCT URINALYSIS DIP (MANUAL ENTRY)
Glucose, UA: NEGATIVE mg/dL
Ketones, POC UA: NEGATIVE mg/dL
Nitrite, UA: NEGATIVE
Protein Ur, POC: 30 mg/dL — AB
Spec Grav, UA: >=1.03 — AB (ref 1.010–1.025)
Urobilinogen, UA: 0.2 E.U./dL
pH, UA: 6 (ref 5.0–8.0)

## 2019-09-25 MED ORDER — CEPHALEXIN 500 MG PO CAPS: 500.0000 mg | ORAL_CAPSULE | Freq: Two times a day (BID) | ORAL | 0 refills | Status: AC

## 2019-09-25 MED ORDER — FLUCONAZOLE 150 MG PO TABS: 150.0000 mg | ORAL_TABLET | Freq: Every day | ORAL | 0 refills | Status: AC

## 2019-09-25 NOTE — ED Provider Notes (Signed)
[name]    CSN: [id number] Arrival date & time: 09/25/19  1150      History   Chief Complaint Chief Complaint  Patient presents with  . Dysuria  . Hematuria    HPI Sharon Mitchell is a 74 y.o. female with history of skin cancer, RA, diabetes, hypertension, OSA presenting for possible UTI.  Worsening urgency, frequency, lower abdominal discomfort since yesterday.  Noted blood in urine this morning.  Denies vaginal discharge or pelvic pain, back pain, fever.   Past Medical History:  Diagnosis Date  . Arthritis    RA  . Cancer ([place])    skin cancer  . Diabetes mellitus without complication ([place])   . Dyspnea    November 2009- low risk nuclear stress test, four minute, decrease exercise tolerance , echo cardiogram with mild diastolic dysfuction ,normal ,EF   . Hyperlipidemia   . Hypertension   . OSA (obstructive sleep apnea)     Patient Active Problem List   Diagnosis Date Noted  . Diabetes mellitus with stage 2 chronic kidney disease ([place]) 10/08/2013  . HTN (hypertension), benign 10/08/2013    Past Surgical History:  Procedure Laterality Date  . ABDOMINAL HYSTERECTOMY    . APPENDECTOMY    . arthroscopic rotator cuff repair    . BACK SURGERY    . bladder tack    . COLONOSCOPY    . COLONOSCOPY WITH PROPOFOL N/A 02/11/2017   Procedure: COLONOSCOPY WITH PROPOFOL;  Surgeon: Toledo, Benay Pike, MD;  Location: [facility];  Service: Gastroenterology;  Laterality: N/A;  . ESOPHAGOGASTRODUODENOSCOPY (EGD) WITH PROPOFOL N/A 02/11/2017   Procedure: ESOPHAGOGASTRODUODENOSCOPY (EGD) WITH PROPOFOL;  Surgeon: Toledo, Benay Pike, MD;  Location: [facility];  Service: Gastroenterology;  Laterality: N/A;  . melanoma removal      OB History   No obstetric history on file.      Home Medications    Prior to Admission medications   Medication Sig Start Date End Date Taking? Authorizing Provider  amLODipine (NORVASC) 5 MG tablet Take 5 mg daily by  mouth.    [provider]  Ascorbic Acid (VITAMIN C) 100 MG tablet Take 100 mg by mouth daily.     [provider]  blood glucose meter kit and supplies KIT daily as needed by Does not apply route. Dispense based on patient and insurance preference. Use up to four times daily as directed. (FOR ICD-9 250.00, 250.01).    [provider]  calcium-vitamin D (OSCAL WITH D) 500-200 MG-UNIT tablet Take 1 tablet by mouth.    [provider]  cephALEXin (KEFLEX) 500 MG capsule Take 1 capsule (500 mg total) by mouth 2 (two) times daily for 5 days. 09/25/19 09/30/19  Hall-Potvin, [place], PA-C  Cyanocobalamin (VITAMIN B-12 IJ) Inject 1,000 mcg as directed every 30 (thirty) days.    [provider]  fluconazole (DIFLUCAN) 150 MG tablet Take 1 tablet (150 mg total) by mouth daily. May repeat in 72 hours if needed 09/25/19   Hall-Potvin, [place], PA-C  fluticasone (FLONASE) 50 MCG/ACT nasal spray Place daily into both nostrils.    [provider]  folic acid (FOLVITE) 1 MG tablet Take 2 mg by mouth daily.     [provider]  hydroxychloroquine (PLAQUENIL) 200 MG tablet Take 200 mg by mouth daily.     [provider]  LANCETS MICRO THIN 33G MISC by Does not apply route.    [provider]  lisinopril-hydrochlorothiazide (PRINZIDE,ZESTORETIC) 20-12.5 MG tablet Take 1  tablet daily by mouth.    [provider]  loratadine (CLARITIN) 10 MG tablet Take 10 mg daily by mouth.    [provider]  Methotrexate, PF, 25 MG/0.5ML SOAJ Inject 50 mg into the skin once a week. Tuesday    [provider]  Multiple Vitamin (MULTIVITAMIN) tablet Take 1 tablet daily by mouth.    [provider]  naproxen sodium (ALEVE) 220 MG tablet Take 220 mg by mouth 2 (two) times daily as needed.     [provider]  pioglitazone (ACTOS) 15 MG tablet Take 15 mg by mouth daily.    [provider]  pyridOXINE  (VITAMIN B-6) 100 MG tablet Take 100 mg by mouth daily.     [provider]  RYBELSUS 3 MG TABS  09/02/18   [provider]  TURMERIC PO Take 50 mg by mouth daily.    [provider]    Family History Family History  Problem Relation Age of Onset  . Uterine cancer Mother   . Colon polyps Father   . CAD Father   . Parkinsonism Father   . Colon cancer Maternal Grandmother     Social History Social History   Tobacco Use  . Smoking status: Never Smoker  . Smokeless tobacco: Never Used  Substance Use Topics  . Alcohol use: No  . Drug use: No     Allergies   Bactrim [sulfamethoxazole-trimethoprim], Hydrocodone, Iodine, Niaspan [niacin er], Statins, and Sulfa antibiotics   Review of Systems As per HPI   Physical Exam Triage Vital Signs ED Triage Vitals  Enc Vitals Group     BP      Pulse      Resp      Temp      Temp src      SpO2      Weight      Height      Head Circumference      Peak Flow      Pain Score      Pain Loc      Pain Edu?      Excl. in [place]?    No data found.  Updated Vital Signs BP (!) 152/72 (BP Location: Right Arm)   Pulse 98   Temp 97.9 F (36.6 C) (Oral)   Resp 18   SpO2 95%   Visual Acuity Right Eye Distance:   Left Eye Distance:   Bilateral Distance:    Right Eye Near:   Left Eye Near:    Bilateral Near:     Physical Exam Constitutional:      General: She is not in acute distress. HENT:     Head: Normocephalic and atraumatic.  Eyes:     General: No scleral icterus.    Pupils: Pupils are equal, round, and reactive to light.  Cardiovascular:     Rate and Rhythm: Normal rate.  Pulmonary:     Effort: Pulmonary effort is normal.  Abdominal:     General: Bowel sounds are normal.     Palpations: Abdomen is soft.     Tenderness: There is no abdominal tenderness. There is no right CVA tenderness, left CVA tenderness or guarding.  Skin:    Coloration: Skin is not jaundiced or pale.  Neurological:       Mental Status: She is alert and oriented to person, place, and time.      UC Treatments / Results  Labs (all labs ordered are listed, but only abnormal  results are displayed) Labs Reviewed  POCT URINALYSIS DIP (MANUAL ENTRY) - Abnormal; Notable for the following components:      Result Value   Clarity, UA cloudy (*)    Bilirubin, UA small (*)    Spec Grav, UA >=1.030 (*)    Blood, UA large (*)    Protein Ur, POC =30 (*)    Leukocytes, UA Large (3+) (*)    All other components within normal limits  URINE CULTURE    EKG   Radiology No results found.  Procedures Procedures (including critical care time)  Medications Ordered in UC Medications - No data to display  Initial Impression / Assessment and Plan / UC Course  I have reviewed the triage vital signs and the nursing notes.  Pertinent labs & imaging results that were available during my care of the patient were reviewed by me and considered in my medical decision making (see chart for details).     Patient febrile, nontoxic in office today.  No CVA tenderness.  Urine dipstick positive for leukocytes, protein, blood, elevated specific gravity, small bilirubin.  Will start Keflex, send urine for culture.  Patient requesting Diflucan for post antibiotic yeast vaginitis.  Return precautions discussed, patient verbalized understanding and is agreeable to plan. Final Clinical Impressions(s) / UC Diagnoses   Final diagnoses:  Acute cystitis with hematuria     Discharge Instructions     Take antibiotic twice daily with food. Important to drink plenty of water throughout the day. Return for worsening urinary symptoms, blood in urine, abdominal or back pain, fever.    ED Prescriptions    Medication Sig Dispense Auth. Provider   cephALEXin (KEFLEX) 500 MG capsule Take 1 capsule (500 mg total) by mouth 2 (two) times daily for 5 days. 10 capsule Hall-Potvin, [place], PA-C   fluconazole (DIFLUCAN) 150 MG tablet Take  1 tablet (150 mg total) by mouth daily. May repeat in 72 hours if needed 2 tablet Hall-Potvin, [place], PA-C     PDMP not reviewed this encounter.   Hall-Potvin, [place], [place] 09/25/19 1250

## 2019-09-25 NOTE — Discharge Instructions (Signed)
Take antibiotic twice daily with food. Important to drink plenty of water throughout the day. Return for worsening urinary symptoms, blood in urine, abdominal or back pain, fever. 

## 2019-09-25 NOTE — ED Triage Notes (Signed)
Pt here for UTI sx starting yesterday with blood noted today

## 2019-09-26 DIAGNOSIS — E538 Deficiency of other specified B group vitamins: Secondary | ICD-10-CM | POA: Diagnosis not present

## 2019-09-26 DIAGNOSIS — E539 Vitamin B deficiency, unspecified: Secondary | ICD-10-CM | POA: Diagnosis not present

## 2019-09-28 LAB — URINE CULTURE: Culture: 60000 — AB

## 2019-10-11 DIAGNOSIS — R21 Rash and other nonspecific skin eruption: Secondary | ICD-10-CM | POA: Diagnosis not present

## 2019-10-11 DIAGNOSIS — E785 Hyperlipidemia, unspecified: Secondary | ICD-10-CM | POA: Diagnosis not present

## 2019-10-11 DIAGNOSIS — E1122 Type 2 diabetes mellitus with diabetic chronic kidney disease: Secondary | ICD-10-CM | POA: Diagnosis not present

## 2019-10-11 DIAGNOSIS — E1169 Type 2 diabetes mellitus with other specified complication: Secondary | ICD-10-CM | POA: Diagnosis not present

## 2019-10-11 DIAGNOSIS — N182 Chronic kidney disease, stage 2 (mild): Secondary | ICD-10-CM | POA: Diagnosis not present

## 2019-10-17 DIAGNOSIS — T466X5A Adverse effect of antihyperlipidemic and antiarteriosclerotic drugs, initial encounter: Secondary | ICD-10-CM | POA: Diagnosis not present

## 2019-10-17 DIAGNOSIS — E785 Hyperlipidemia, unspecified: Secondary | ICD-10-CM | POA: Diagnosis not present

## 2019-10-17 DIAGNOSIS — I10 Essential (primary) hypertension: Secondary | ICD-10-CM | POA: Diagnosis not present

## 2019-10-17 DIAGNOSIS — E1169 Type 2 diabetes mellitus with other specified complication: Secondary | ICD-10-CM | POA: Diagnosis not present

## 2019-10-17 DIAGNOSIS — N182 Chronic kidney disease, stage 2 (mild): Secondary | ICD-10-CM | POA: Diagnosis not present

## 2019-10-17 DIAGNOSIS — E1122 Type 2 diabetes mellitus with diabetic chronic kidney disease: Secondary | ICD-10-CM | POA: Diagnosis not present

## 2019-10-17 DIAGNOSIS — M0609 Rheumatoid arthritis without rheumatoid factor, multiple sites: Secondary | ICD-10-CM | POA: Diagnosis not present

## 2019-10-17 DIAGNOSIS — M791 Myalgia, unspecified site: Secondary | ICD-10-CM | POA: Diagnosis not present

## 2019-10-27 DIAGNOSIS — E539 Vitamin B deficiency, unspecified: Secondary | ICD-10-CM | POA: Diagnosis not present

## 2019-10-27 DIAGNOSIS — E538 Deficiency of other specified B group vitamins: Secondary | ICD-10-CM | POA: Diagnosis not present

## 2019-11-22 DIAGNOSIS — H8101 Meniere's disease, right ear: Secondary | ICD-10-CM | POA: Diagnosis not present

## 2019-11-22 DIAGNOSIS — H903 Sensorineural hearing loss, bilateral: Secondary | ICD-10-CM | POA: Diagnosis not present

## 2019-11-29 DIAGNOSIS — E538 Deficiency of other specified B group vitamins: Secondary | ICD-10-CM | POA: Diagnosis not present

## 2019-11-29 DIAGNOSIS — M0609 Rheumatoid arthritis without rheumatoid factor, multiple sites: Secondary | ICD-10-CM | POA: Diagnosis not present

## 2019-11-29 DIAGNOSIS — Z79899 Other long term (current) drug therapy: Secondary | ICD-10-CM | POA: Diagnosis not present

## 2019-11-29 DIAGNOSIS — E539 Vitamin B deficiency, unspecified: Secondary | ICD-10-CM | POA: Diagnosis not present

## 2019-12-05 DIAGNOSIS — E119 Type 2 diabetes mellitus without complications: Secondary | ICD-10-CM | POA: Diagnosis not present

## 2019-12-05 DIAGNOSIS — Z79899 Other long term (current) drug therapy: Secondary | ICD-10-CM | POA: Diagnosis not present

## 2019-12-05 DIAGNOSIS — H25013 Cortical age-related cataract, bilateral: Secondary | ICD-10-CM | POA: Diagnosis not present

## 2019-12-08 DIAGNOSIS — M720 Palmar fascial fibromatosis [Dupuytren]: Secondary | ICD-10-CM | POA: Diagnosis not present

## 2019-12-08 DIAGNOSIS — M0609 Rheumatoid arthritis without rheumatoid factor, multiple sites: Secondary | ICD-10-CM | POA: Diagnosis not present

## 2019-12-08 DIAGNOSIS — G629 Polyneuropathy, unspecified: Secondary | ICD-10-CM | POA: Diagnosis not present

## 2019-12-08 DIAGNOSIS — Z79899 Other long term (current) drug therapy: Secondary | ICD-10-CM | POA: Diagnosis not present

## 2019-12-08 DIAGNOSIS — M8949 Other hypertrophic osteoarthropathy, multiple sites: Secondary | ICD-10-CM | POA: Diagnosis not present

## 2020-01-19 DIAGNOSIS — E538 Deficiency of other specified B group vitamins: Secondary | ICD-10-CM | POA: Diagnosis not present

## 2020-02-01 ENCOUNTER — Other Ambulatory Visit: Payer: Self-pay

## 2020-02-01 NOTE — Patient Outreach (Addendum)
  [facility]) Care Management Chronic Special Needs Program    02/01/2020  Name: Sharon Mitchell, DOB: 1946/02/17  MRN: [id number]   Ms. Sharon Mitchell is enrolled in a chronic special needs plan for Diabetes.  Member will be followed by the [facility] Case Management team beginning April 07, 2020. Case is closed by [facility], will reopen if needed before 04/07/2020 [facility] Case Management Team will follow member moving forward with assessments, care plans and documentation Sharon Garter RN, [facility], CDE Chronic Care Management Coordinator [place] [facility] [phone]   Addendum: Client will be followed by the [facility] Case Management team beginning April 07, 2020. [facility] will continue to follow client through 04/06/20 [place], Sharon Mitchell, [place] [facility] [phone]

## 2020-02-13 DIAGNOSIS — N182 Chronic kidney disease, stage 2 (mild): Secondary | ICD-10-CM | POA: Diagnosis not present

## 2020-02-13 DIAGNOSIS — E785 Hyperlipidemia, unspecified: Secondary | ICD-10-CM | POA: Diagnosis not present

## 2020-02-13 DIAGNOSIS — E1169 Type 2 diabetes mellitus with other specified complication: Secondary | ICD-10-CM | POA: Diagnosis not present

## 2020-02-13 DIAGNOSIS — E1122 Type 2 diabetes mellitus with diabetic chronic kidney disease: Secondary | ICD-10-CM | POA: Diagnosis not present

## 2020-02-13 DIAGNOSIS — I10 Essential (primary) hypertension: Secondary | ICD-10-CM | POA: Diagnosis not present

## 2020-02-20 DIAGNOSIS — E1169 Type 2 diabetes mellitus with other specified complication: Secondary | ICD-10-CM | POA: Diagnosis not present

## 2020-02-20 DIAGNOSIS — E1122 Type 2 diabetes mellitus with diabetic chronic kidney disease: Secondary | ICD-10-CM | POA: Diagnosis not present

## 2020-02-20 DIAGNOSIS — M791 Myalgia, unspecified site: Secondary | ICD-10-CM | POA: Diagnosis not present

## 2020-02-20 DIAGNOSIS — N182 Chronic kidney disease, stage 2 (mild): Secondary | ICD-10-CM | POA: Diagnosis not present

## 2020-02-20 DIAGNOSIS — E538 Deficiency of other specified B group vitamins: Secondary | ICD-10-CM | POA: Diagnosis not present

## 2020-02-20 DIAGNOSIS — T466X5A Adverse effect of antihyperlipidemic and antiarteriosclerotic drugs, initial encounter: Secondary | ICD-10-CM | POA: Diagnosis not present

## 2020-02-20 DIAGNOSIS — M0609 Rheumatoid arthritis without rheumatoid factor, multiple sites: Secondary | ICD-10-CM | POA: Diagnosis not present

## 2020-02-20 DIAGNOSIS — E785 Hyperlipidemia, unspecified: Secondary | ICD-10-CM | POA: Diagnosis not present

## 2020-02-20 DIAGNOSIS — I10 Essential (primary) hypertension: Secondary | ICD-10-CM | POA: Diagnosis not present

## 2020-02-20 DIAGNOSIS — Z0000 Encounter for general adult medical examination without abnormal findings: Secondary | ICD-10-CM | POA: Diagnosis not present

## 2020-03-08 DIAGNOSIS — M0609 Rheumatoid arthritis without rheumatoid factor, multiple sites: Secondary | ICD-10-CM | POA: Diagnosis not present

## 2020-03-08 DIAGNOSIS — Z79899 Other long term (current) drug therapy: Secondary | ICD-10-CM | POA: Diagnosis not present

## 2020-03-09 ENCOUNTER — Ambulatory Visit
Admission: EM | Admit: 2020-03-09 | Discharge: 2020-03-09 | Disposition: A | Discharge status: Home / Self Care | Payer: HMO | Attending: Family Medicine | Admitting: Family Medicine

## 2020-03-09 DIAGNOSIS — N764 Abscess of vulva: Secondary | ICD-10-CM | POA: Diagnosis not present

## 2020-03-09 MED ORDER — DOXYCYCLINE HYCLATE 100 MG PO CAPS: 100.0000 mg | ORAL_CAPSULE | Freq: Two times a day (BID) | ORAL | 0 refills | Status: AC

## 2020-03-09 NOTE — ED Provider Notes (Signed)
[name]    CSN: [id number] Arrival date & time: 03/09/20  1002      History   Chief Complaint Chief Complaint  Patient presents with   Abscess    HPI Sharon Mitchell is a 74 y.o. female.   HPI Patient presents today for evaluation of the left labia abscess which has been present x2 days.  The area has been draining and is tender to touch.  Patient has a history of diabetes and has had a previous vaginal abscess.  She denies any nausea, vomiting or diarrhea.  She denies fever. Past Medical History:  Diagnosis Date   Arthritis    RA   Cancer ([place])    skin cancer   Diabetes mellitus without complication ([place])    Dyspnea    November 2009- low risk nuclear stress test, four minute, decrease exercise tolerance , echo cardiogram with mild diastolic dysfuction ,normal ,EF    Hyperlipidemia    Hypertension    OSA (obstructive sleep apnea)     Patient Active Problem List   Diagnosis Date Noted   Diabetes mellitus with stage 2 chronic kidney disease ([place]) 10/08/2013   HTN (hypertension), benign 10/08/2013    Past Surgical History:  Procedure Laterality Date   ABDOMINAL HYSTERECTOMY     APPENDECTOMY     arthroscopic rotator cuff repair     BACK SURGERY     bladder tack     COLONOSCOPY     COLONOSCOPY WITH PROPOFOL N/A 02/11/2017   Procedure: COLONOSCOPY WITH PROPOFOL;  Surgeon: Toledo, Benay Pike, MD;  Location: [facility];  Service: Gastroenterology;  Laterality: N/A;   ESOPHAGOGASTRODUODENOSCOPY (EGD) WITH PROPOFOL N/A 02/11/2017   Procedure: ESOPHAGOGASTRODUODENOSCOPY (EGD) WITH PROPOFOL;  Surgeon: Toledo, Benay Pike, MD;  Location: [facility];  Service: Gastroenterology;  Laterality: N/A;   melanoma removal      OB History   No obstetric history on file.      Home Medications    Prior to Admission medications   Medication Sig Start Date End Date Taking? Authorizing Provider  amLODipine (NORVASC) 5 MG tablet Take  5 mg daily by mouth.    [provider]  Ascorbic Acid (VITAMIN C) 100 MG tablet Take 100 mg by mouth daily.     [provider]  blood glucose meter kit and supplies KIT daily as needed by Does not apply route. Dispense based on patient and insurance preference. Use up to four times daily as directed. (FOR ICD-9 250.00, 250.01).    [provider]  calcium-vitamin D (OSCAL WITH D) 500-200 MG-UNIT tablet Take 1 tablet by mouth.    [provider]  Cyanocobalamin (VITAMIN B-12 IJ) Inject 1,000 mcg as directed every 30 (thirty) days.    [provider]  fluconazole (DIFLUCAN) 150 MG tablet Take 1 tablet (150 mg total) by mouth daily. May repeat in 72 hours if needed 09/25/19   Hall-Potvin, [place], PA-C  fluticasone (FLONASE) 50 MCG/ACT nasal spray Place daily into both nostrils.    [provider]  folic acid (FOLVITE) 1 MG tablet Take 2 mg by mouth daily.     [provider]  hydroxychloroquine (PLAQUENIL) 200 MG tablet Take 200 mg by mouth daily.     [provider]  LANCETS MICRO THIN 33G MISC by Does not apply route.    [provider]  lisinopril-hydrochlorothiazide (PRINZIDE,ZESTORETIC) 20-12.5 MG tablet Take 1 tablet daily by mouth.    [provider]  loratadine (CLARITIN) 10 MG  tablet Take 10 mg daily by mouth.    [provider]  Methotrexate, PF, 25 MG/0.5ML SOAJ Inject 50 mg into the skin once a week. Tuesday    [provider]  Multiple Vitamin (MULTIVITAMIN) tablet Take 1 tablet daily by mouth.    [provider]  naproxen sodium (ALEVE) 220 MG tablet Take 220 mg by mouth 2 (two) times daily as needed.     [provider]  pyridOXINE (VITAMIN B-6) 100 MG tablet Take 100 mg by mouth daily.     [provider]  RYBELSUS 3 MG TABS  09/02/18   [provider]  TURMERIC PO Take 50 mg by mouth daily.    [provider]    Family  History Family History  Problem Relation Age of Onset   Uterine cancer Mother    Colon polyps Father    CAD Father    Parkinsonism Father    Colon cancer Maternal Grandmother     Social History Social History   Tobacco Use   Smoking status: Never Smoker   Smokeless tobacco: Never Used  Substance Use Topics   Alcohol use: No   Drug use: No     Allergies   Bactrim [sulfamethoxazole-trimethoprim], Hydrocodone, Iodine, Niaspan [niacin er], Statins, and Sulfa antibiotics   Review of Systems Review of Systems Pertinent negatives listed in HPI     Physical Exam Triage Vital Signs ED Triage Vitals  Enc Vitals Group     BP 03/09/20 1123 (!) 153/85     Pulse Rate 03/09/20 1123 88     Resp 03/09/20 1123 18     Temp 03/09/20 1123 99.1 F (37.3 C)     Temp Source 03/09/20 1123 Oral     SpO2 03/09/20 1123 96 %     Weight --      Height --      Head Circumference --      Peak Flow --      Pain Score 03/09/20 1124 3     Pain Loc --      Pain Edu? --      Excl. in [place]? --    No data found.  Updated Vital Signs BP (!) 153/85 (BP Location: Left Arm)    Pulse 88    Temp 99.1 F (37.3 C) (Oral)    Resp 18    SpO2 96%   Visual Acuity Right Eye Distance:   Left Eye Distance:   Bilateral Distance:    Right Eye Near:   Left Eye Near:    Bilateral Near:     Physical Exam General appearance: alert, well developed, well nourished, cooperative and in no distress Head: Normocephalic, without obvious abnormality, atraumatic Respiratory: Respirations even and unlabored, normal respiratory rate Heart: rate and rhythm normal. No gallop or murmurs noted on exam  Skin: Skin color, texture, turgor normal. No rashes seen (left labia abscess present)  Psych: Appropriate mood and affect. Neurologic: Mental status: Alert, oriented to person, place, and time, thought content appropriate.   UC Treatments / Results  Labs (all labs ordered are listed, but only abnormal  results are displayed) Labs Reviewed - No data to display  EKG   Radiology No results found.  Procedures Incision and Drainage  Date/Time: 03/09/2020 1:34 PM Performed by: Scot Jun, FNP Authorized by: Scot Jun, FNP   Consent:    Consent obtained:  Verbal   Consent given by:  Patient   Risks discussed:  Incomplete drainage  and infection   Alternatives discussed:  No treatment Location:    Type:  Abscess   Location:  Anogenital Anesthesia (see MAR for exact dosages):    Anesthesia method:  Topical application Procedure type:    Complexity:  Simple Procedure details:    Needle aspiration: no     Incision types:  Stab incision   Scalpel blade:  10   Wound management:  Probed and deloculated   Drainage:  Bloody and purulent   Drainage amount:  Moderate Post-procedure details:    Patient tolerance of procedure:  Tolerated well, no immediate complications   (including critical care time)  Medications Ordered in UC Medications - No data to display  Initial Impression / Assessment and Plan / UC Course  I have reviewed the triage vital signs and the nursing notes.  Pertinent labs & imaging results that were available during my care of the patient were reviewed by me and considered in my medical decision making (see chart for details).    Left genital labial abscess.  I&D performed.  Patient tolerated.  Dressing applied.  Doxycycline 100 mg twice daily over the next 7 days given patient's history of diabetes and previous history of recurrent abscess which required extended antibiotic treatment.  Red flags discussed that indicate need to follow-up for evaluation.  Patient verbalized understanding and agreement with plan. Final Clinical Impressions(s) / UC Diagnoses   Final diagnoses:  Left genital labial abscess     Discharge Instructions     Continue to keep a dressing applied to area until bleeding stops.  Bleeding should resolve within the next 24  hours.  Start doxycycline 100 mg twice daily over the course of the next 10 days.  If the area becomes severely painful or increased redness occurs return for evaluation.    ED Prescriptions    Medication Sig Dispense Auth. Provider   doxycycline (VIBRAMYCIN) 100 MG capsule Take 1 capsule (100 mg total) by mouth 2 (two) times daily. 20 capsule Scot Jun, FNP     PDMP not reviewed this encounter.   Scot Jun, FNP 03/09/20 1335

## 2020-03-09 NOTE — Discharge Instructions (Addendum)
Continue to keep a dressing applied to area until bleeding stops.  Bleeding should resolve within the next 24 hours.  Start doxycycline 100 mg twice daily over the course of the next 10 days.  If the area becomes severely painful or increased redness occurs return for evaluation.

## 2020-03-09 NOTE — ED Triage Notes (Signed)
Pt c/o abscess to vagina area since yesterday. C/o redness and pain. No drainage at this time.

## 2020-03-22 DIAGNOSIS — E539 Vitamin B deficiency, unspecified: Secondary | ICD-10-CM | POA: Diagnosis not present

## 2020-03-22 DIAGNOSIS — E538 Deficiency of other specified B group vitamins: Secondary | ICD-10-CM | POA: Diagnosis not present

## 2020-04-13 ENCOUNTER — Other Ambulatory Visit: Payer: Self-pay

## 2020-05-08 DIAGNOSIS — M5032 Other cervical disc degeneration, mid-cervical region, unspecified level: Secondary | ICD-10-CM | POA: Diagnosis not present

## 2020-05-08 DIAGNOSIS — M25512 Pain in left shoulder: Secondary | ICD-10-CM | POA: Diagnosis not present

## 2020-05-15 DIAGNOSIS — M5412 Radiculopathy, cervical region: Secondary | ICD-10-CM | POA: Diagnosis not present

## 2020-05-15 DIAGNOSIS — M25512 Pain in left shoulder: Secondary | ICD-10-CM | POA: Diagnosis not present

## 2020-05-22 DIAGNOSIS — M25512 Pain in left shoulder: Secondary | ICD-10-CM | POA: Diagnosis not present

## 2020-05-22 DIAGNOSIS — H9041 Sensorineural hearing loss, unilateral, right ear, with unrestricted hearing on the contralateral side: Secondary | ICD-10-CM | POA: Diagnosis not present

## 2020-05-22 DIAGNOSIS — H8101 Meniere's disease, right ear: Secondary | ICD-10-CM | POA: Diagnosis not present

## 2020-05-22 DIAGNOSIS — M5412 Radiculopathy, cervical region: Secondary | ICD-10-CM | POA: Diagnosis not present

## 2020-05-28 DIAGNOSIS — M5412 Radiculopathy, cervical region: Secondary | ICD-10-CM | POA: Diagnosis not present

## 2020-05-28 DIAGNOSIS — M25512 Pain in left shoulder: Secondary | ICD-10-CM | POA: Diagnosis not present

## 2020-05-29 DIAGNOSIS — E538 Deficiency of other specified B group vitamins: Secondary | ICD-10-CM | POA: Diagnosis not present

## 2020-06-04 DIAGNOSIS — M25512 Pain in left shoulder: Secondary | ICD-10-CM | POA: Diagnosis not present

## 2020-06-04 DIAGNOSIS — M5412 Radiculopathy, cervical region: Secondary | ICD-10-CM | POA: Diagnosis not present

## 2020-06-06 DIAGNOSIS — I10 Essential (primary) hypertension: Secondary | ICD-10-CM | POA: Diagnosis not present

## 2020-06-06 DIAGNOSIS — Z79899 Other long term (current) drug therapy: Secondary | ICD-10-CM | POA: Diagnosis not present

## 2020-06-06 DIAGNOSIS — M0609 Rheumatoid arthritis without rheumatoid factor, multiple sites: Secondary | ICD-10-CM | POA: Diagnosis not present

## 2020-06-06 DIAGNOSIS — N182 Chronic kidney disease, stage 2 (mild): Secondary | ICD-10-CM | POA: Diagnosis not present

## 2020-06-06 DIAGNOSIS — E1122 Type 2 diabetes mellitus with diabetic chronic kidney disease: Secondary | ICD-10-CM | POA: Diagnosis not present

## 2020-06-07 ENCOUNTER — Ambulatory Visit: Payer: HMO

## 2020-06-11 DIAGNOSIS — M5412 Radiculopathy, cervical region: Secondary | ICD-10-CM | POA: Diagnosis not present

## 2020-06-11 DIAGNOSIS — M25512 Pain in left shoulder: Secondary | ICD-10-CM | POA: Diagnosis not present

## 2020-06-11 DIAGNOSIS — M79671 Pain in right foot: Secondary | ICD-10-CM | POA: Diagnosis not present

## 2020-06-12 DIAGNOSIS — M720 Palmar fascial fibromatosis [Dupuytren]: Secondary | ICD-10-CM | POA: Diagnosis not present

## 2020-06-12 DIAGNOSIS — E785 Hyperlipidemia, unspecified: Secondary | ICD-10-CM | POA: Diagnosis not present

## 2020-06-12 DIAGNOSIS — E1169 Type 2 diabetes mellitus with other specified complication: Secondary | ICD-10-CM | POA: Diagnosis not present

## 2020-06-12 DIAGNOSIS — M8949 Other hypertrophic osteoarthropathy, multiple sites: Secondary | ICD-10-CM | POA: Diagnosis not present

## 2020-06-12 DIAGNOSIS — Z79899 Other long term (current) drug therapy: Secondary | ICD-10-CM | POA: Diagnosis not present

## 2020-06-12 DIAGNOSIS — M7062 Trochanteric bursitis, left hip: Secondary | ICD-10-CM | POA: Diagnosis not present

## 2020-06-12 DIAGNOSIS — E1122 Type 2 diabetes mellitus with diabetic chronic kidney disease: Secondary | ICD-10-CM | POA: Diagnosis not present

## 2020-06-12 DIAGNOSIS — M791 Myalgia, unspecified site: Secondary | ICD-10-CM | POA: Diagnosis not present

## 2020-06-12 DIAGNOSIS — M0609 Rheumatoid arthritis without rheumatoid factor, multiple sites: Secondary | ICD-10-CM | POA: Diagnosis not present

## 2020-06-12 DIAGNOSIS — T466X5A Adverse effect of antihyperlipidemic and antiarteriosclerotic drugs, initial encounter: Secondary | ICD-10-CM | POA: Diagnosis not present

## 2020-06-12 DIAGNOSIS — N182 Chronic kidney disease, stage 2 (mild): Secondary | ICD-10-CM | POA: Diagnosis not present

## 2020-06-12 DIAGNOSIS — I10 Essential (primary) hypertension: Secondary | ICD-10-CM | POA: Diagnosis not present

## 2020-06-27 DIAGNOSIS — M79671 Pain in right foot: Secondary | ICD-10-CM | POA: Diagnosis not present

## 2020-06-29 DIAGNOSIS — E538 Deficiency of other specified B group vitamins: Secondary | ICD-10-CM | POA: Diagnosis not present

## 2020-06-29 DIAGNOSIS — E539 Vitamin B deficiency, unspecified: Secondary | ICD-10-CM | POA: Diagnosis not present

## 2020-07-04 DIAGNOSIS — D692 Other nonthrombocytopenic purpura: Secondary | ICD-10-CM | POA: Diagnosis not present

## 2020-07-04 DIAGNOSIS — Z85820 Personal history of malignant melanoma of skin: Secondary | ICD-10-CM | POA: Diagnosis not present

## 2020-07-04 DIAGNOSIS — L821 Other seborrheic keratosis: Secondary | ICD-10-CM | POA: Diagnosis not present

## 2020-07-04 DIAGNOSIS — L820 Inflamed seborrheic keratosis: Secondary | ICD-10-CM | POA: Diagnosis not present

## 2020-07-04 DIAGNOSIS — L814 Other melanin hyperpigmentation: Secondary | ICD-10-CM | POA: Diagnosis not present

## 2020-07-17 ENCOUNTER — Other Ambulatory Visit: Payer: Self-pay | Admitting: Internal Medicine

## 2020-07-17 DIAGNOSIS — Z1231 Encounter for screening mammogram for malignant neoplasm of breast: Secondary | ICD-10-CM

## 2020-07-30 DIAGNOSIS — E538 Deficiency of other specified B group vitamins: Secondary | ICD-10-CM | POA: Diagnosis not present

## 2020-08-30 DIAGNOSIS — E538 Deficiency of other specified B group vitamins: Secondary | ICD-10-CM | POA: Diagnosis not present

## 2020-08-30 DIAGNOSIS — E539 Vitamin B deficiency, unspecified: Secondary | ICD-10-CM | POA: Diagnosis not present

## 2020-09-05 ENCOUNTER — Ambulatory Visit
Admission: RE | Admit: 2020-09-05 | Discharge: 2020-09-05 | Disposition: A | Discharge status: Home / Self Care | Payer: HMO | Source: Ambulatory Visit | Attending: Internal Medicine | Admitting: Internal Medicine

## 2020-09-05 ENCOUNTER — Other Ambulatory Visit: Payer: Self-pay

## 2020-09-05 DIAGNOSIS — Z1231 Encounter for screening mammogram for malignant neoplasm of breast: Secondary | ICD-10-CM

## 2020-09-12 DIAGNOSIS — M0609 Rheumatoid arthritis without rheumatoid factor, multiple sites: Secondary | ICD-10-CM | POA: Diagnosis not present

## 2020-09-12 DIAGNOSIS — Z79899 Other long term (current) drug therapy: Secondary | ICD-10-CM | POA: Diagnosis not present

## 2020-09-20 DIAGNOSIS — Z85820 Personal history of malignant melanoma of skin: Secondary | ICD-10-CM | POA: Diagnosis not present

## 2020-09-20 DIAGNOSIS — D225 Melanocytic nevi of trunk: Secondary | ICD-10-CM | POA: Diagnosis not present

## 2020-09-20 DIAGNOSIS — L821 Other seborrheic keratosis: Secondary | ICD-10-CM | POA: Diagnosis not present

## 2020-09-20 DIAGNOSIS — L814 Other melanin hyperpigmentation: Secondary | ICD-10-CM | POA: Diagnosis not present

## 2020-10-01 DIAGNOSIS — E538 Deficiency of other specified B group vitamins: Secondary | ICD-10-CM | POA: Diagnosis not present

## 2020-10-04 DIAGNOSIS — E1122 Type 2 diabetes mellitus with diabetic chronic kidney disease: Secondary | ICD-10-CM | POA: Diagnosis not present

## 2020-10-04 DIAGNOSIS — N182 Chronic kidney disease, stage 2 (mild): Secondary | ICD-10-CM | POA: Diagnosis not present

## 2020-10-04 DIAGNOSIS — E785 Hyperlipidemia, unspecified: Secondary | ICD-10-CM | POA: Diagnosis not present

## 2020-10-04 DIAGNOSIS — E1169 Type 2 diabetes mellitus with other specified complication: Secondary | ICD-10-CM | POA: Diagnosis not present

## 2020-10-05 DIAGNOSIS — U071 COVID-19: Secondary | ICD-10-CM | POA: Diagnosis not present

## 2020-10-05 DIAGNOSIS — Z03818 Encounter for observation for suspected exposure to other biological agents ruled out: Secondary | ICD-10-CM | POA: Diagnosis not present

## 2020-10-05 DIAGNOSIS — I10 Essential (primary) hypertension: Secondary | ICD-10-CM | POA: Diagnosis not present

## 2020-10-05 DIAGNOSIS — Z7189 Other specified counseling: Secondary | ICD-10-CM | POA: Diagnosis not present

## 2020-10-12 DIAGNOSIS — T466X5A Adverse effect of antihyperlipidemic and antiarteriosclerotic drugs, initial encounter: Secondary | ICD-10-CM | POA: Diagnosis not present

## 2020-10-12 DIAGNOSIS — M791 Myalgia, unspecified site: Secondary | ICD-10-CM | POA: Diagnosis not present

## 2020-10-12 DIAGNOSIS — I10 Essential (primary) hypertension: Secondary | ICD-10-CM | POA: Diagnosis not present

## 2020-10-12 DIAGNOSIS — N182 Chronic kidney disease, stage 2 (mild): Secondary | ICD-10-CM | POA: Diagnosis not present

## 2020-10-12 DIAGNOSIS — E785 Hyperlipidemia, unspecified: Secondary | ICD-10-CM | POA: Diagnosis not present

## 2020-10-12 DIAGNOSIS — M0609 Rheumatoid arthritis without rheumatoid factor, multiple sites: Secondary | ICD-10-CM | POA: Diagnosis not present

## 2020-10-12 DIAGNOSIS — E1122 Type 2 diabetes mellitus with diabetic chronic kidney disease: Secondary | ICD-10-CM | POA: Diagnosis not present

## 2020-10-12 DIAGNOSIS — E1169 Type 2 diabetes mellitus with other specified complication: Secondary | ICD-10-CM | POA: Diagnosis not present

## 2020-11-01 DIAGNOSIS — E538 Deficiency of other specified B group vitamins: Secondary | ICD-10-CM | POA: Diagnosis not present

## 2020-11-20 DIAGNOSIS — H8102 Meniere's disease, left ear: Secondary | ICD-10-CM | POA: Diagnosis not present

## 2020-11-20 DIAGNOSIS — H903 Sensorineural hearing loss, bilateral: Secondary | ICD-10-CM | POA: Diagnosis not present

## 2020-11-20 DIAGNOSIS — H6121 Impacted cerumen, right ear: Secondary | ICD-10-CM | POA: Diagnosis not present

## 2020-12-03 DIAGNOSIS — E538 Deficiency of other specified B group vitamins: Secondary | ICD-10-CM | POA: Diagnosis not present

## 2020-12-04 DIAGNOSIS — Z79899 Other long term (current) drug therapy: Secondary | ICD-10-CM | POA: Diagnosis not present

## 2020-12-04 DIAGNOSIS — E119 Type 2 diabetes mellitus without complications: Secondary | ICD-10-CM | POA: Diagnosis not present

## 2020-12-04 DIAGNOSIS — H2513 Age-related nuclear cataract, bilateral: Secondary | ICD-10-CM | POA: Diagnosis not present

## 2020-12-13 DIAGNOSIS — M0609 Rheumatoid arthritis without rheumatoid factor, multiple sites: Secondary | ICD-10-CM | POA: Diagnosis not present

## 2020-12-13 DIAGNOSIS — Z79899 Other long term (current) drug therapy: Secondary | ICD-10-CM | POA: Diagnosis not present

## 2020-12-18 DIAGNOSIS — M1811 Unilateral primary osteoarthritis of first carpometacarpal joint, right hand: Secondary | ICD-10-CM | POA: Diagnosis not present

## 2020-12-18 DIAGNOSIS — M720 Palmar fascial fibromatosis [Dupuytren]: Secondary | ICD-10-CM | POA: Diagnosis not present

## 2020-12-18 DIAGNOSIS — Z79899 Other long term (current) drug therapy: Secondary | ICD-10-CM | POA: Diagnosis not present

## 2020-12-18 DIAGNOSIS — M0609 Rheumatoid arthritis without rheumatoid factor, multiple sites: Secondary | ICD-10-CM | POA: Diagnosis not present

## 2020-12-18 DIAGNOSIS — M159 Polyosteoarthritis, unspecified: Secondary | ICD-10-CM | POA: Diagnosis not present

## 2021-01-03 DIAGNOSIS — E538 Deficiency of other specified B group vitamins: Secondary | ICD-10-CM | POA: Diagnosis not present

## 2021-02-04 DIAGNOSIS — E538 Deficiency of other specified B group vitamins: Secondary | ICD-10-CM | POA: Diagnosis not present

## 2021-02-04 DIAGNOSIS — E539 Vitamin B deficiency, unspecified: Secondary | ICD-10-CM | POA: Diagnosis not present

## 2021-03-07 DIAGNOSIS — E538 Deficiency of other specified B group vitamins: Secondary | ICD-10-CM | POA: Diagnosis not present

## 2021-03-20 DIAGNOSIS — E1169 Type 2 diabetes mellitus with other specified complication: Secondary | ICD-10-CM | POA: Diagnosis not present

## 2021-03-20 DIAGNOSIS — M0609 Rheumatoid arthritis without rheumatoid factor, multiple sites: Secondary | ICD-10-CM | POA: Diagnosis not present

## 2021-03-20 DIAGNOSIS — E785 Hyperlipidemia, unspecified: Secondary | ICD-10-CM | POA: Diagnosis not present

## 2021-03-20 DIAGNOSIS — E1122 Type 2 diabetes mellitus with diabetic chronic kidney disease: Secondary | ICD-10-CM | POA: Diagnosis not present

## 2021-03-20 DIAGNOSIS — N182 Chronic kidney disease, stage 2 (mild): Secondary | ICD-10-CM | POA: Diagnosis not present

## 2021-03-20 DIAGNOSIS — I10 Essential (primary) hypertension: Secondary | ICD-10-CM | POA: Diagnosis not present

## 2021-03-20 DIAGNOSIS — Z79899 Other long term (current) drug therapy: Secondary | ICD-10-CM | POA: Diagnosis not present

## 2021-03-27 DIAGNOSIS — M0609 Rheumatoid arthritis without rheumatoid factor, multiple sites: Secondary | ICD-10-CM | POA: Diagnosis not present

## 2021-03-27 DIAGNOSIS — M1811 Unilateral primary osteoarthritis of first carpometacarpal joint, right hand: Secondary | ICD-10-CM | POA: Diagnosis not present

## 2021-03-27 DIAGNOSIS — N182 Chronic kidney disease, stage 2 (mild): Secondary | ICD-10-CM | POA: Diagnosis not present

## 2021-03-27 DIAGNOSIS — T466X5A Adverse effect of antihyperlipidemic and antiarteriosclerotic drugs, initial encounter: Secondary | ICD-10-CM | POA: Diagnosis not present

## 2021-03-27 DIAGNOSIS — E1169 Type 2 diabetes mellitus with other specified complication: Secondary | ICD-10-CM | POA: Diagnosis not present

## 2021-03-27 DIAGNOSIS — I10 Essential (primary) hypertension: Secondary | ICD-10-CM | POA: Diagnosis not present

## 2021-03-27 DIAGNOSIS — Z0000 Encounter for general adult medical examination without abnormal findings: Secondary | ICD-10-CM | POA: Diagnosis not present

## 2021-03-27 DIAGNOSIS — E785 Hyperlipidemia, unspecified: Secondary | ICD-10-CM | POA: Diagnosis not present

## 2021-03-27 DIAGNOSIS — M791 Myalgia, unspecified site: Secondary | ICD-10-CM | POA: Diagnosis not present

## 2021-03-27 DIAGNOSIS — E1122 Type 2 diabetes mellitus with diabetic chronic kidney disease: Secondary | ICD-10-CM | POA: Diagnosis not present

## 2021-04-09 DIAGNOSIS — E538 Deficiency of other specified B group vitamins: Secondary | ICD-10-CM | POA: Diagnosis not present

## 2021-04-09 DIAGNOSIS — E539 Vitamin B deficiency, unspecified: Secondary | ICD-10-CM | POA: Diagnosis not present

## 2021-05-02 DIAGNOSIS — M159 Polyosteoarthritis, unspecified: Secondary | ICD-10-CM | POA: Diagnosis not present

## 2021-05-02 DIAGNOSIS — M19042 Primary osteoarthritis, left hand: Secondary | ICD-10-CM | POA: Diagnosis not present

## 2021-05-02 DIAGNOSIS — M720 Palmar fascial fibromatosis [Dupuytren]: Secondary | ICD-10-CM | POA: Diagnosis not present

## 2021-05-02 DIAGNOSIS — M19041 Primary osteoarthritis, right hand: Secondary | ICD-10-CM | POA: Diagnosis not present

## 2021-05-02 DIAGNOSIS — M069 Rheumatoid arthritis, unspecified: Secondary | ICD-10-CM | POA: Diagnosis not present

## 2021-05-02 DIAGNOSIS — G629 Polyneuropathy, unspecified: Secondary | ICD-10-CM | POA: Diagnosis not present

## 2021-05-02 DIAGNOSIS — M1811 Unilateral primary osteoarthritis of first carpometacarpal joint, right hand: Secondary | ICD-10-CM | POA: Diagnosis not present

## 2021-05-02 DIAGNOSIS — M0609 Rheumatoid arthritis without rheumatoid factor, multiple sites: Secondary | ICD-10-CM | POA: Diagnosis not present

## 2021-05-10 DIAGNOSIS — E538 Deficiency of other specified B group vitamins: Secondary | ICD-10-CM | POA: Diagnosis not present

## 2021-05-10 DIAGNOSIS — E539 Vitamin B deficiency, unspecified: Secondary | ICD-10-CM | POA: Diagnosis not present

## 2021-05-16 DIAGNOSIS — M0609 Rheumatoid arthritis without rheumatoid factor, multiple sites: Secondary | ICD-10-CM | POA: Diagnosis not present

## 2021-05-16 DIAGNOSIS — G5601 Carpal tunnel syndrome, right upper limb: Secondary | ICD-10-CM | POA: Diagnosis not present

## 2021-05-16 DIAGNOSIS — M720 Palmar fascial fibromatosis [Dupuytren]: Secondary | ICD-10-CM | POA: Diagnosis not present

## 2021-05-16 DIAGNOSIS — M1811 Unilateral primary osteoarthritis of first carpometacarpal joint, right hand: Secondary | ICD-10-CM | POA: Diagnosis not present

## 2021-05-16 DIAGNOSIS — G629 Polyneuropathy, unspecified: Secondary | ICD-10-CM | POA: Diagnosis not present

## 2021-05-20 DIAGNOSIS — H8101 Meniere's disease, right ear: Secondary | ICD-10-CM | POA: Diagnosis not present

## 2021-05-20 DIAGNOSIS — H9041 Sensorineural hearing loss, unilateral, right ear, with unrestricted hearing on the contralateral side: Secondary | ICD-10-CM | POA: Diagnosis not present

## 2021-05-20 DIAGNOSIS — H9311 Tinnitus, right ear: Secondary | ICD-10-CM | POA: Diagnosis not present

## 2021-06-10 DIAGNOSIS — E538 Deficiency of other specified B group vitamins: Secondary | ICD-10-CM | POA: Diagnosis not present

## 2021-06-17 DIAGNOSIS — M0609 Rheumatoid arthritis without rheumatoid factor, multiple sites: Secondary | ICD-10-CM | POA: Diagnosis not present

## 2021-06-17 DIAGNOSIS — Z79899 Other long term (current) drug therapy: Secondary | ICD-10-CM | POA: Diagnosis not present

## 2021-06-18 DIAGNOSIS — M79641 Pain in right hand: Secondary | ICD-10-CM | POA: Diagnosis not present

## 2021-06-18 DIAGNOSIS — M79642 Pain in left hand: Secondary | ICD-10-CM | POA: Diagnosis not present

## 2021-07-11 DIAGNOSIS — E538 Deficiency of other specified B group vitamins: Secondary | ICD-10-CM | POA: Diagnosis not present

## 2021-07-18 DIAGNOSIS — M159 Polyosteoarthritis, unspecified: Secondary | ICD-10-CM | POA: Diagnosis not present

## 2021-07-18 DIAGNOSIS — M47812 Spondylosis without myelopathy or radiculopathy, cervical region: Secondary | ICD-10-CM | POA: Diagnosis not present

## 2021-07-18 DIAGNOSIS — M0609 Rheumatoid arthritis without rheumatoid factor, multiple sites: Secondary | ICD-10-CM | POA: Diagnosis not present

## 2021-07-18 DIAGNOSIS — Z79899 Other long term (current) drug therapy: Secondary | ICD-10-CM | POA: Diagnosis not present

## 2021-07-18 DIAGNOSIS — G629 Polyneuropathy, unspecified: Secondary | ICD-10-CM | POA: Diagnosis not present

## 2021-07-18 DIAGNOSIS — Z8739 Personal history of other diseases of the musculoskeletal system and connective tissue: Secondary | ICD-10-CM | POA: Diagnosis not present

## 2021-07-18 DIAGNOSIS — F4321 Adjustment disorder with depressed mood: Secondary | ICD-10-CM | POA: Diagnosis not present

## 2021-08-07 DIAGNOSIS — R200 Anesthesia of skin: Secondary | ICD-10-CM | POA: Diagnosis not present

## 2021-08-09 ENCOUNTER — Other Ambulatory Visit: Payer: Self-pay | Admitting: Internal Medicine

## 2021-08-09 DIAGNOSIS — Z1231 Encounter for screening mammogram for malignant neoplasm of breast: Secondary | ICD-10-CM

## 2021-08-12 DIAGNOSIS — E538 Deficiency of other specified B group vitamins: Secondary | ICD-10-CM | POA: Diagnosis not present

## 2021-08-25 ENCOUNTER — Ambulatory Visit
Admission: EM | Admit: 2021-08-25 | Discharge: 2021-08-25 | Disposition: A | Discharge status: Home / Self Care | Payer: HMO

## 2021-08-25 ENCOUNTER — Ambulatory Visit (INDEPENDENT_AMBULATORY_CARE_PROVIDER_SITE_OTHER): Payer: HMO

## 2021-08-25 ENCOUNTER — Encounter: Payer: Self-pay | Admitting: Emergency Medicine

## 2021-08-25 DIAGNOSIS — R051 Acute cough: Secondary | ICD-10-CM

## 2021-08-25 DIAGNOSIS — J069 Acute upper respiratory infection, unspecified: Secondary | ICD-10-CM

## 2021-08-25 DIAGNOSIS — R059 Cough, unspecified: Secondary | ICD-10-CM | POA: Diagnosis not present

## 2021-08-25 MED ORDER — CEFDINIR 300 MG PO CAPS: 300.0000 mg | ORAL_CAPSULE | Freq: Two times a day (BID) | ORAL | 0 refills | Status: AC

## 2021-08-25 MED ORDER — PREDNISONE 20 MG PO TABS: 40.0000 mg | ORAL_TABLET | Freq: Every day | ORAL | 0 refills | Status: AC

## 2021-08-25 MED ORDER — BENZONATATE 100 MG PO CAPS: 100.0000 mg | ORAL_CAPSULE | Freq: Three times a day (TID) | ORAL | 0 refills | Status: AC | PRN

## 2021-08-25 NOTE — ED Triage Notes (Signed)
Patient c/o non-productive cough, nasal drainage, slight headache x 1 week.  Patient has taken Mucinex.  Sores on the outside of her mouth as well.

## 2021-08-25 NOTE — ED Provider Notes (Addendum)
[name]    CSN: [id number] Arrival date & time: 08/25/21  0948      History   Chief Complaint Chief Complaint  Patient presents with   Cough    HPI Sharon Mitchell is a 76 y.o. female.   Patient presents with nonproductive cough, nasal drainage, nasal congestion, mild headache that has been present for approximately 1 week.  She reports that she has been exposed to a friend who had a similar symptoms.  Denies any known fevers at home.  Denies chest pain, sore throat, ear pain, nausea, vomiting, diarrhea, abdominal pain.  Patient does report intermittent shortness of breath with exertion.  Patient has taken Mucinex for symptoms.  She does have history of asthma in childhood and uses albuterol inhaler as needed for acute illnesses but reports that she has not needed it for this illness.  Also complains of some sores inside her lips.  They started around the same time as upper respiratory symptoms.   Cough  Past Medical History:  Diagnosis Date   Arthritis    RA   Cancer ([place])    skin cancer   Diabetes mellitus without complication ([place])    Dyspnea    November 2009- low risk nuclear stress test, four minute, decrease exercise tolerance , echo cardiogram with mild diastolic dysfuction ,normal ,EF    Hyperlipidemia    Hypertension    OSA (obstructive sleep apnea)     Patient Active Problem List   Diagnosis Date Noted   Diabetes mellitus with stage 2 chronic kidney disease ([place]) 10/08/2013   HTN (hypertension), benign 10/08/2013    Past Surgical History:  Procedure Laterality Date   ABDOMINAL HYSTERECTOMY     APPENDECTOMY     arthroscopic rotator cuff repair     BACK SURGERY     bladder tack     COLONOSCOPY     COLONOSCOPY WITH PROPOFOL N/A 02/11/2017   Procedure: COLONOSCOPY WITH PROPOFOL;  Surgeon: Toledo, Benay Pike, MD;  Location: [facility];  Service: Gastroenterology;  Laterality: N/A;   ESOPHAGOGASTRODUODENOSCOPY (EGD) WITH PROPOFOL  N/A 02/11/2017   Procedure: ESOPHAGOGASTRODUODENOSCOPY (EGD) WITH PROPOFOL;  Surgeon: Toledo, Benay Pike, MD;  Location: [facility];  Service: Gastroenterology;  Laterality: N/A;   melanoma removal      OB History   No obstetric history on file.      Home Medications    Prior to Admission medications   Medication Sig Start Date End Date Taking? Authorizing Provider  amLODipine (NORVASC) 5 MG tablet Take 5 mg daily by mouth.   Yes [provider]  Ascorbic Acid (VITAMIN C) 100 MG tablet Take 100 mg by mouth daily.    Yes [provider]  benzonatate (TESSALON) 100 MG capsule Take 1 capsule (100 mg total) by mouth every 8 (eight) hours as needed for cough. 08/25/21  Yes Kaede Clendenen, [place], FNP  blood glucose meter kit and supplies KIT daily as needed by Does not apply route. Dispense based on patient and insurance preference. Use up to four times daily as directed. (FOR ICD-9 250.00, 250.01).   Yes [provider]  calcium-vitamin D (OSCAL WITH D) 500-200 MG-UNIT tablet Take 1 tablet by mouth.   Yes [provider]  cefdinir (OMNICEF) 300 MG capsule Take 1 capsule (300 mg total) by mouth 2 (two) times daily for 10 days. 08/25/21 09/04/21 Yes Swain Acree, Michele Rockers, FNP  Cyanocobalamin (VITAMIN B-12 IJ) Inject 1,000 mcg as directed every 30 (thirty) days.   Yes  [provider]  doxycycline (VIBRAMYCIN) 100 MG capsule Take 1 capsule (100 mg total) by mouth 2 (two) times daily. 03/09/20  Yes Scot Jun, FNP  DULoxetine (CYMBALTA) 30 MG capsule TAKE 1 CAP ONCE DAILY FOR 45 DAYS, THEN 2 CAPSULES (60 MG TOTAL) ONCE DAILY FOR 45 DAYS. 08/05/21  Yes [provider]  fluconazole (DIFLUCAN) 150 MG tablet Take 1 tablet (150 mg total) by mouth daily. May repeat in 72 hours if needed 09/25/19  Yes Hall-Potvin, [place], PA-C  fluticasone (FLONASE) 50 MCG/ACT nasal spray Place daily into both nostrils.   Yes [provider]  folic acid (FOLVITE) 1 MG  tablet Take 2 mg by mouth daily.    Yes [provider]  hydroxychloroquine (PLAQUENIL) 200 MG tablet Take 200 mg by mouth daily.    Yes [provider]  LANCETS MICRO THIN 33G MISC by Does not apply route.   Yes [provider]  lisinopril-hydrochlorothiazide (PRINZIDE,ZESTORETIC) 20-12.5 MG tablet Take 1 tablet daily by mouth.   Yes [provider]  loratadine (CLARITIN) 10 MG tablet Take 10 mg daily by mouth.   Yes [provider]  Methotrexate, PF, 25 MG/0.5ML SOAJ Inject 50 mg into the skin once a week. Tuesday   Yes [provider]  Multiple Vitamin (MULTIVITAMIN) tablet Take 1 tablet daily by mouth.   Yes [provider]  naproxen sodium (ALEVE) 220 MG tablet Take 220 mg by mouth 2 (two) times daily as needed.    Yes [provider]  predniSONE (DELTASONE) 20 MG tablet Take 2 tablets (40 mg total) by mouth daily for 5 days. 08/25/21 08/30/21 Yes Natally Ribera, Hildred Alamin E, FNP  pyridOXINE (VITAMIN B-6) 100 MG tablet Take 100 mg by mouth daily.    Yes [provider]  RYBELSUS 3 MG TABS  09/02/18  Yes [provider]  TURMERIC PO Take 50 mg by mouth daily.   Yes [provider]    Family History Family History  Problem Relation Age of Onset   Uterine cancer Mother    Colon polyps Father    CAD Father    Parkinsonism Father    Colon cancer Maternal Grandmother     Social History Social History   Tobacco Use   Smoking status: Never   Smokeless tobacco: Never  Substance Use Topics   Alcohol use: No   Drug use: No     Allergies   Bactrim [sulfamethoxazole-trimethoprim], Hydrocodone, Iodine, Niaspan [niacin er], Statins, and Sulfa antibiotics   Review of Systems Review of Systems Per HPI  Physical Exam Triage Vital Signs ED Triage Vitals  Enc Vitals Group     BP 08/25/21 1123 (!) 143/79     Pulse Rate 08/25/21 1123 (!) 109     Resp 08/25/21 1123 18     Temp 08/25/21 1123 98.1 F  (36.7 C)     Temp Source 08/25/21 1123 Oral     SpO2 08/25/21 1123 92 %     Weight 08/25/21 1125 200 lb (90.7 kg)     Height 08/25/21 1125 5' 8"  (1.727 m)     Head Circumference --      Peak Flow --      Pain Score 08/25/21 1124 2     Pain Loc --      Pain Edu? --      Excl. in [place]? --    No data found.  Updated Vital Signs BP (!) 143/79 (BP Location: Left Arm)   Pulse Marland Kitchen)  109   Temp 98.1 F (36.7 C) (Oral)   Resp 18   Ht 5' 8"  (1.727 m)   Wt 200 lb (90.7 kg)   SpO2 96%   BMI 30.41 kg/m   Visual Acuity Right Eye Distance:   Left Eye Distance:   Bilateral Distance:    Right Eye Near:   Left Eye Near:    Bilateral Near:     Physical Exam Constitutional:      General: She is not in acute distress.    Appearance: Normal appearance. She is not toxic-appearing or diaphoretic.  HENT:     Head: Normocephalic and atraumatic.     Right Ear: Tympanic membrane and ear canal normal.     Left Ear: Tympanic membrane and ear canal normal.     Nose: Congestion present.     Mouth/Throat:     Lips: Lesions present.     Mouth: Mucous membranes are moist.     Pharynx: No posterior oropharyngeal erythema.     Comments: There are linear vesicular-like lesions present to inner portion of lips. Eyes:     Extraocular Movements: Extraocular movements intact.     Conjunctiva/sclera: Conjunctivae normal.     Pupils: Pupils are equal, round, and reactive to light.  Cardiovascular:     Rate and Rhythm: Normal rate and regular rhythm.     Pulses: Normal pulses.     Heart sounds: Normal heart sounds.  Pulmonary:     Effort: Pulmonary effort is normal. No respiratory distress.     Breath sounds: No stridor. Rhonchi present. No wheezing or rales.  Abdominal:     General: Abdomen is flat. Bowel sounds are normal.     Palpations: Abdomen is soft.  Musculoskeletal:        General: Normal range of motion.     Cervical back: Normal range of motion.  Skin:    General: Skin is warm and dry.   Neurological:     General: No focal deficit present.     Mental Status: She is alert and oriented to person, place, and time. Mental status is at baseline.  Psychiatric:        Mood and Affect: Mood normal.        Behavior: Behavior normal.     UC Treatments / Results  Labs (all labs ordered are listed, but only abnormal results are displayed) Labs Reviewed - No data to display  EKG   Radiology DG Chest 2 View  Result Date: 08/25/2021 CLINICAL DATA:  Cough EXAM: CHEST - 2 VIEW COMPARISON:  Images of previous study done on 05/31/2000 are not available for comparison. FINDINGS: Cardiac size is within normal limits. There are no signs of alveolar pulmonary edema or focal pulmonary consolidation. There is no pleural effusion or pneumothorax. IMPRESSION: No active cardiopulmonary disease. Electronically Signed   By: Elmer Picker M.D.   On: 08/25/2021 12:14    Procedures Procedures (including critical care time)  Medications Ordered in UC Medications - No data to display  Initial Impression / Assessment and Plan / UC Course  I have reviewed the triage vital signs and the nursing notes.  Pertinent labs & imaging results that were available during my care of the patient were reviewed by me and considered in my medical decision making (see chart for details).     Chest x-ray was negative for any acute cardiopulmonary process.  Suspect viral process to symptoms such as acute viral bronchitis.  Although, patient does have rhonchi and  harsh productive cough on exam so will treat with cefdinir antibiotic as well.  Cefdinir antibiotic appears to be only safe antibiotic given the patient takes methotrexate.  Patient is taking prednisone and tolerated well in the past given history of RA so will treat with prednisone to decrease bronchitis and acute cough.  Benzonatate prescribed take as needed for cough.  Discussed supportive care and symptom management as well.  Patient advised to  monitor blood sugar at home while taking prednisone.  Lesions of lip appear to be fever blisters but unsure exact etiology.  Patient was offered Valtrex but declined.  Risks associated with not taking Valtrex were discussed with patient.  Patient voiced understanding.  Discussed supportive care and symptom management for these.  No signs of respiratory distress and oxygen was normal so no need for emergent valuation in the hospital at this time.  Discussed return precautions.  Patient verbalized understanding and was agreeable with plan. Final Clinical Impressions(s) / UC Diagnoses   Final diagnoses:  Acute upper respiratory infection  Acute cough     Discharge Instructions      Chest x-ray was normal.  You have been prescribed 3 medications to alleviate symptoms.  Please follow-up if symptoms persist or worsen.     ED Prescriptions     Medication Sig Dispense Auth. Provider   predniSONE (DELTASONE) 20 MG tablet Take 2 tablets (40 mg total) by mouth daily for 5 days. 10 tablet [place], [place] E, [place]   cefdinir (OMNICEF) 300 MG capsule Take 1 capsule (300 mg total) by mouth 2 (two) times daily for 10 days. 20 capsule [place], [place] E, [place]   benzonatate (TESSALON) 100 MG capsule Take 1 capsule (100 mg total) by mouth every 8 (eight) hours as needed for cough. 21 capsule [place], Michele Rockers, [place]      PDMP not reviewed this encounter.   Teodora Medici, [place] 08/25/21 [place], [place], [place] 08/25/21 [place], [place], [place] 08/25/21 1249

## 2021-08-25 NOTE — Discharge Instructions (Signed)
Chest x-ray was normal.  You have been prescribed 3 medications to alleviate symptoms.  Please follow-up if symptoms persist or worsen.

## 2021-09-03 ENCOUNTER — Ambulatory Visit (INDEPENDENT_AMBULATORY_CARE_PROVIDER_SITE_OTHER): Payer: HMO

## 2021-09-03 ENCOUNTER — Ambulatory Visit
Admission: RE | Admit: 2021-09-03 | Discharge: 2021-09-03 | Disposition: A | Discharge status: Home / Self Care | Payer: HMO | Source: Ambulatory Visit | Attending: Internal Medicine | Admitting: Internal Medicine

## 2021-09-03 VITALS — BP 112/58 | HR 95 | Temp 98.5°F | Resp 20

## 2021-09-03 DIAGNOSIS — R053 Chronic cough: Secondary | ICD-10-CM

## 2021-09-03 DIAGNOSIS — R059 Cough, unspecified: Secondary | ICD-10-CM | POA: Diagnosis not present

## 2021-09-03 MED ORDER — PREDNISONE 10 MG (21) PO TBPK: ORAL_TABLET | Freq: Every day | ORAL | 0 refills | Status: AC

## 2021-09-03 NOTE — ED Triage Notes (Signed)
Patient c/o cough, sore chest from coughing, completed steroids and cough, no better.

## 2021-09-03 NOTE — ED Provider Notes (Signed)
[name]    CSN: [id number] Arrival date & time: 09/03/21  1056      History   Chief Complaint Chief Complaint  Patient presents with   Cough    Entered by patient   Appointment    HPI Sharon Mitchell is a 76 y.o. female.   Patient presents with persistent upper respiratory symptoms and cough since being seen approximately 1 week ago at urgent care.  Patient was prescribed cough medication, prednisone, cefdinir antibiotic with no improvement in symptoms.  Patient reports that she thinks that cough is worsened.  Denies any associated shortness of breath, fever, chest pain.   Cough  Past Medical History:  Diagnosis Date   Arthritis    RA   Cancer ([place])    skin cancer   Diabetes mellitus without complication ([place])    Dyspnea    November 2009- low risk nuclear stress test, four minute, decrease exercise tolerance , echo cardiogram with mild diastolic dysfuction ,normal ,EF    Hyperlipidemia    Hypertension    OSA (obstructive sleep apnea)     Patient Active Problem List   Diagnosis Date Noted   Diabetes mellitus with stage 2 chronic kidney disease ([place]) 10/08/2013   HTN (hypertension), benign 10/08/2013    Past Surgical History:  Procedure Laterality Date   ABDOMINAL HYSTERECTOMY     APPENDECTOMY     arthroscopic rotator cuff repair     BACK SURGERY     bladder tack     COLONOSCOPY     COLONOSCOPY WITH PROPOFOL N/A 02/11/2017   Procedure: COLONOSCOPY WITH PROPOFOL;  Surgeon: Toledo, Benay Pike, MD;  Location: [facility];  Service: Gastroenterology;  Laterality: N/A;   ESOPHAGOGASTRODUODENOSCOPY (EGD) WITH PROPOFOL N/A 02/11/2017   Procedure: ESOPHAGOGASTRODUODENOSCOPY (EGD) WITH PROPOFOL;  Surgeon: Toledo, Benay Pike, MD;  Location: [facility] ENDOSCOPY;  Service: Gastroenterology;  Laterality: N/A;   melanoma removal      OB History   No obstetric history on file.      Home Medications    Prior to Admission medications   Medication  Sig Start Date End Date Taking? Authorizing Provider  amLODipine (NORVASC) 5 MG tablet Take 5 mg daily by mouth.   Yes [provider]  Ascorbic Acid (VITAMIN C) 100 MG tablet Take 100 mg by mouth daily.    Yes [provider]  benzonatate (TESSALON) 100 MG capsule Take 1 capsule (100 mg total) by mouth every 8 (eight) hours as needed for cough. 08/25/21  Yes Rosaelena Kemnitz, [place], FNP  blood glucose meter kit and supplies KIT daily as needed by Does not apply route. Dispense based on patient and insurance preference. Use up to four times daily as directed. (FOR ICD-9 250.00, 250.01).   Yes [provider]  calcium-vitamin D (OSCAL WITH D) 500-200 MG-UNIT tablet Take 1 tablet by mouth.   Yes [provider]  cefdinir (OMNICEF) 300 MG capsule Take 1 capsule (300 mg total) by mouth 2 (two) times daily for 10 days. 08/25/21 09/04/21 Yes Kavya Haag, Michele Rockers, FNP  Cyanocobalamin (VITAMIN B-12 IJ) Inject 1,000 mcg as directed every 30 (thirty) days.   Yes [provider]  doxycycline (VIBRAMYCIN) 100 MG capsule Take 1 capsule (100 mg total) by mouth 2 (two) times daily. 03/09/20  Yes Scot Jun, FNP  DULoxetine (CYMBALTA) 30 MG capsule TAKE 1 CAP ONCE DAILY FOR 45 DAYS, THEN 2 CAPSULES (60 MG TOTAL) ONCE DAILY FOR 45 DAYS. 08/05/21  Yes [provider]  fluconazole (DIFLUCAN) 150 MG tablet Take 1 tablet (150 mg total) by mouth daily. May repeat in 72 hours if needed 09/25/19  Yes Hall-Potvin, [place], PA-C  fluticasone (FLONASE) 50 MCG/ACT nasal spray Place daily into both nostrils.   Yes [provider]  folic acid (FOLVITE) 1 MG tablet Take 2 mg by mouth daily.    Yes [provider]  hydroxychloroquine (PLAQUENIL) 200 MG tablet Take 200 mg by mouth daily.    Yes [provider]  LANCETS MICRO THIN 33G MISC by Does not apply route.   Yes [provider]  lisinopril-hydrochlorothiazide (PRINZIDE,ZESTORETIC) 20-12.5 MG tablet  Take 1 tablet daily by mouth.   Yes [provider]  loratadine (CLARITIN) 10 MG tablet Take 10 mg daily by mouth.   Yes [provider]  Methotrexate, PF, 25 MG/0.5ML SOAJ Inject 50 mg into the skin once a week. Tuesday   Yes [provider]  Multiple Vitamin (MULTIVITAMIN) tablet Take 1 tablet daily by mouth.   Yes [provider]  naproxen sodium (ALEVE) 220 MG tablet Take 220 mg by mouth 2 (two) times daily as needed.    Yes [provider]  predniSONE (STERAPRED UNI-PAK 21 TAB) 10 MG (21) TBPK tablet Take by mouth daily. Take 6 tabs by mouth daily  for 2 days, then 5 tabs for 2 days, then 4 tabs for 2 days, then 3 tabs for 2 days, 2 tabs for 2 days, then 1 tab by mouth daily for 2 days 09/03/21  Yes Pranay Hilbun, [place] E, FNP  pyridOXINE (VITAMIN B-6) 100 MG tablet Take 100 mg by mouth daily.    Yes [provider]  RYBELSUS 3 MG TABS  09/02/18  Yes [provider]  TURMERIC PO Take 50 mg by mouth daily.   Yes [provider]    Family History Family History  Problem Relation Age of Onset   Uterine cancer Mother    Colon polyps Father    CAD Father    Parkinsonism Father    Colon cancer Maternal Grandmother     Social History Social History   Tobacco Use   Smoking status: Never   Smokeless tobacco: Never  Substance Use Topics   Alcohol use: No   Drug use: No     Allergies   Bactrim [sulfamethoxazole-trimethoprim], Hydrocodone, Iodine, Niaspan [niacin er], Statins, and Sulfa antibiotics   Review of Systems Review of Systems Per HPI  Physical Exam Triage Vital Signs ED Triage Vitals  Enc Vitals Group     BP 09/03/21 1128 (!) 112/58     Pulse Rate 09/03/21 1128 95     Resp 09/03/21 1128 20     Temp 09/03/21 1128 98.5 F (36.9 C)     Temp Source 09/03/21 1128 Oral     SpO2 09/03/21 1128 93 %     Weight --      Height --      Head Circumference --      Peak Flow --      Pain Score 09/03/21 1130 0      Pain Loc --      Pain Edu? --      Excl. in [place]? --    No data found.  Updated Vital Signs BP (!) 112/58 (BP Location: Left Arm)   Pulse 95   Temp 98.5 F (36.9 C) (Oral)   Resp 20   SpO2 93%   Visual Acuity Right Eye Distance:   Left Eye Distance:  Bilateral Distance:    Right Eye Near:   Left Eye Near:    Bilateral Near:     Physical Exam Constitutional:      General: She is not in acute distress.    Appearance: Normal appearance. She is not toxic-appearing or diaphoretic.  HENT:     Head: Normocephalic and atraumatic.     Right Ear: Tympanic membrane and ear canal normal.     Left Ear: Tympanic membrane and ear canal normal.     Nose: Congestion present.     Mouth/Throat:     Mouth: Mucous membranes are moist.     Pharynx: No posterior oropharyngeal erythema.  Eyes:     Extraocular Movements: Extraocular movements intact.     Conjunctiva/sclera: Conjunctivae normal.     Pupils: Pupils are equal, round, and reactive to light.  Cardiovascular:     Rate and Rhythm: Normal rate and regular rhythm.     Pulses: Normal pulses.     Heart sounds: Normal heart sounds.  Pulmonary:     Effort: Pulmonary effort is normal. No respiratory distress.     Breath sounds: Normal breath sounds. No stridor. No wheezing, rhonchi or rales.  Abdominal:     General: Abdomen is flat. Bowel sounds are normal.     Palpations: Abdomen is soft.  Musculoskeletal:        General: Normal range of motion.     Cervical back: Normal range of motion.  Skin:    General: Skin is warm and dry.  Neurological:     General: No focal deficit present.     Mental Status: She is alert and oriented to person, place, and time. Mental status is at baseline.  Psychiatric:        Mood and Affect: Mood normal.        Behavior: Behavior normal.     UC Treatments / Results  Labs (all labs ordered are listed, but only abnormal results are displayed) Labs Reviewed - No data to  display  EKG   Radiology DG Chest 2 View  Result Date: 09/03/2021 CLINICAL DATA:  Cough EXAM: CHEST - 2 VIEW COMPARISON:  08/25/2021 FINDINGS: The heart size and mediastinal contours are stable. Lungs remain clear. No pleural effusion. No acute osseous abnormality. IMPRESSION: No acute process in the chest. Electronically Signed   By: Macy Mis M.D.   On: 09/03/2021 11:58    Procedures Procedures (including critical care time)  Medications Ordered in UC Medications - No data to display  Initial Impression / Assessment and Plan / UC Course  I have reviewed the triage vital signs and the nursing notes.  Pertinent labs & imaging results that were available during my care of the patient were reviewed by me and considered in my medical decision making (see chart for details).     Patient's lung sounds are improved.  Chest x-ray was repeated and was negative for any acute cardiopulmonary process.  Suspect persistent cough is related to bronchitis as previously suspected.  I do think the patient would benefit from a higher dose and longer course of prednisone as she has completed the previous dose.  Discussed risks associated with doing prednisone given history of diabetes and encouraged patient to monitor blood sugar.  Patient was also encouraged to follow-up with PCP in the next few days for further evaluation and management.  No signs of respiratory compromise and oxygen was 97% on physical exam.  Discussed strict return and ER precautions.  Patient verbalized  understanding and was agreeable with plan. Final Clinical Impressions(s) / UC Diagnoses   Final diagnoses:  Persistent cough     Discharge Instructions      Chest x-ray was normal.  You have been prescribed prednisone steroid to decrease inflammation.  Please follow-up with primary care doctor for further evaluation and management.     ED Prescriptions     Medication Sig Dispense Auth. Provider   predniSONE (STERAPRED  UNI-PAK 21 TAB) 10 MG (21) TBPK tablet Take by mouth daily. Take 6 tabs by mouth daily  for 2 days, then 5 tabs for 2 days, then 4 tabs for 2 days, then 3 tabs for 2 days, 2 tabs for 2 days, then 1 tab by mouth daily for 2 days 42 tablet [place], Michele Rockers, [place]      PDMP not reviewed this encounter.   Teodora Medici, [place] 09/03/21 1216

## 2021-09-03 NOTE — Discharge Instructions (Signed)
Chest x-ray was normal.  You have been prescribed prednisone steroid to decrease inflammation.  Please follow-up with primary care doctor for further evaluation and management.

## 2021-09-06 ENCOUNTER — Ambulatory Visit: Payer: HMO

## 2021-09-12 DIAGNOSIS — E538 Deficiency of other specified B group vitamins: Secondary | ICD-10-CM | POA: Diagnosis not present

## 2021-09-16 ENCOUNTER — Ambulatory Visit
Admission: RE | Admit: 2021-09-16 | Discharge: 2021-09-16 | Disposition: A | Discharge status: Home / Self Care | Payer: HMO | Source: Ambulatory Visit | Attending: Internal Medicine | Admitting: Internal Medicine

## 2021-09-16 DIAGNOSIS — Z1231 Encounter for screening mammogram for malignant neoplasm of breast: Secondary | ICD-10-CM

## 2021-09-18 DIAGNOSIS — E1122 Type 2 diabetes mellitus with diabetic chronic kidney disease: Secondary | ICD-10-CM | POA: Diagnosis not present

## 2021-09-18 DIAGNOSIS — I10 Essential (primary) hypertension: Secondary | ICD-10-CM | POA: Diagnosis not present

## 2021-09-18 DIAGNOSIS — E785 Hyperlipidemia, unspecified: Secondary | ICD-10-CM | POA: Diagnosis not present

## 2021-09-18 DIAGNOSIS — E1169 Type 2 diabetes mellitus with other specified complication: Secondary | ICD-10-CM | POA: Diagnosis not present

## 2021-09-18 DIAGNOSIS — N182 Chronic kidney disease, stage 2 (mild): Secondary | ICD-10-CM | POA: Diagnosis not present

## 2021-09-24 DIAGNOSIS — D2271 Melanocytic nevi of right lower limb, including hip: Secondary | ICD-10-CM | POA: Diagnosis not present

## 2021-09-24 DIAGNOSIS — H61002 Unspecified perichondritis of left external ear: Secondary | ICD-10-CM | POA: Diagnosis not present

## 2021-09-24 DIAGNOSIS — D2272 Melanocytic nevi of left lower limb, including hip: Secondary | ICD-10-CM | POA: Diagnosis not present

## 2021-09-24 DIAGNOSIS — L814 Other melanin hyperpigmentation: Secondary | ICD-10-CM | POA: Diagnosis not present

## 2021-09-24 DIAGNOSIS — M13842 Other specified arthritis, left hand: Secondary | ICD-10-CM | POA: Diagnosis not present

## 2021-09-24 DIAGNOSIS — L821 Other seborrheic keratosis: Secondary | ICD-10-CM | POA: Diagnosis not present

## 2021-09-24 DIAGNOSIS — Z85820 Personal history of malignant melanoma of skin: Secondary | ICD-10-CM | POA: Diagnosis not present

## 2021-09-24 DIAGNOSIS — M13841 Other specified arthritis, right hand: Secondary | ICD-10-CM | POA: Diagnosis not present

## 2021-09-24 DIAGNOSIS — G5603 Carpal tunnel syndrome, bilateral upper limbs: Secondary | ICD-10-CM | POA: Diagnosis not present

## 2021-09-24 DIAGNOSIS — D485 Neoplasm of uncertain behavior of skin: Secondary | ICD-10-CM | POA: Diagnosis not present

## 2021-09-24 DIAGNOSIS — M720 Palmar fascial fibromatosis [Dupuytren]: Secondary | ICD-10-CM | POA: Diagnosis not present

## 2021-09-24 DIAGNOSIS — L570 Actinic keratosis: Secondary | ICD-10-CM | POA: Diagnosis not present

## 2021-09-25 DIAGNOSIS — M791 Myalgia, unspecified site: Secondary | ICD-10-CM | POA: Diagnosis not present

## 2021-09-25 DIAGNOSIS — I10 Essential (primary) hypertension: Secondary | ICD-10-CM | POA: Diagnosis not present

## 2021-09-25 DIAGNOSIS — E785 Hyperlipidemia, unspecified: Secondary | ICD-10-CM | POA: Diagnosis not present

## 2021-09-25 DIAGNOSIS — N182 Chronic kidney disease, stage 2 (mild): Secondary | ICD-10-CM | POA: Diagnosis not present

## 2021-09-25 DIAGNOSIS — M0609 Rheumatoid arthritis without rheumatoid factor, multiple sites: Secondary | ICD-10-CM | POA: Diagnosis not present

## 2021-09-25 DIAGNOSIS — E1122 Type 2 diabetes mellitus with diabetic chronic kidney disease: Secondary | ICD-10-CM | POA: Diagnosis not present

## 2021-09-25 DIAGNOSIS — T466X5A Adverse effect of antihyperlipidemic and antiarteriosclerotic drugs, initial encounter: Secondary | ICD-10-CM | POA: Diagnosis not present

## 2021-09-25 DIAGNOSIS — E1169 Type 2 diabetes mellitus with other specified complication: Secondary | ICD-10-CM | POA: Diagnosis not present

## 2021-10-06 ENCOUNTER — Ambulatory Visit (INDEPENDENT_AMBULATORY_CARE_PROVIDER_SITE_OTHER): Payer: HMO

## 2021-10-06 ENCOUNTER — Encounter (HOSPITAL_COMMUNITY): Payer: Self-pay | Admitting: Emergency Medicine

## 2021-10-06 ENCOUNTER — Ambulatory Visit (HOSPITAL_COMMUNITY)
Admission: EM | Admit: 2021-10-06 | Discharge: 2021-10-06 | Disposition: A | Discharge status: Home / Self Care | Payer: HMO | Attending: Family Medicine | Admitting: Family Medicine

## 2021-10-06 DIAGNOSIS — S92144A Nondisplaced dome fracture of right talus, initial encounter for closed fracture: Secondary | ICD-10-CM

## 2021-10-06 DIAGNOSIS — S8264XA Nondisplaced fracture of lateral malleolus of right fibula, initial encounter for closed fracture: Secondary | ICD-10-CM

## 2021-10-06 DIAGNOSIS — M7989 Other specified soft tissue disorders: Secondary | ICD-10-CM | POA: Diagnosis not present

## 2021-10-06 DIAGNOSIS — S8261XA Displaced fracture of lateral malleolus of right fibula, initial encounter for closed fracture: Secondary | ICD-10-CM | POA: Diagnosis not present

## 2021-10-06 MED ORDER — TRAMADOL HCL 50 MG PO TABS: 50.0000 mg | ORAL_TABLET | Freq: Four times a day (QID) | ORAL | 0 refills | Status: AC | PRN

## 2021-10-06 MED ORDER — KETOROLAC TROMETHAMINE 30 MG/ML IJ SOLN
INTRAMUSCULAR | Status: AC
  Filled 2021-10-06: qty 1

## 2021-10-06 MED ORDER — KETOROLAC TROMETHAMINE 30 MG/ML IJ SOLN
30.0000 mg | Freq: Once | INTRAMUSCULAR | Status: AC
  Administered 2021-10-06: 30 mg via INTRAMUSCULAR

## 2021-10-06 NOTE — ED Provider Notes (Signed)
[place]    CSN: [id number] Arrival date & time: 10/06/21  1340      History   Chief Complaint Chief Complaint  Patient presents with   Ankle Pain    HPI Sharon Mitchell is a 76 y.o. female.    Ankle Pain  Here for right ankle pain.  Yesterday when she was getting up she got tangled up in the blanket that she had covering her and fell onto the floor on her right ankle.  She now has pain and swelling over her right lateral malleolus and anterior inferior to that ankle.   Past Medical History:  Diagnosis Date   Arthritis    RA   Cancer ([place])    skin cancer   Diabetes mellitus without complication ([place])    Dyspnea    November 2009- low risk nuclear stress test, four minute, decrease exercise tolerance , echo cardiogram with mild diastolic dysfuction ,normal ,EF    Hyperlipidemia    Hypertension    OSA (obstructive sleep apnea)     Patient Active Problem List   Diagnosis Date Noted   Diabetes mellitus with stage 2 chronic kidney disease ([place]) 10/08/2013   HTN (hypertension), benign 10/08/2013    Past Surgical History:  Procedure Laterality Date   ABDOMINAL HYSTERECTOMY     APPENDECTOMY     arthroscopic rotator cuff repair     BACK SURGERY     bladder tack     COLONOSCOPY     COLONOSCOPY WITH PROPOFOL N/A 02/11/2017   Procedure: COLONOSCOPY WITH PROPOFOL;  Surgeon: Toledo, Benay Pike, MD;  Location: [facility];  Service: Gastroenterology;  Laterality: N/A;   ESOPHAGOGASTRODUODENOSCOPY (EGD) WITH PROPOFOL N/A 02/11/2017   Procedure: ESOPHAGOGASTRODUODENOSCOPY (EGD) WITH PROPOFOL;  Surgeon: Toledo, Benay Pike, MD;  Location: [facility];  Service: Gastroenterology;  Laterality: N/A;   melanoma removal      OB History   No obstetric history on file.      Home Medications    Prior to Admission medications   Medication Sig Start Date End Date Taking? Authorizing Provider  traMADol (ULTRAM) 50 MG tablet Take 1 tablet (50 mg total)  by mouth every 6 (six) hours as needed. 10/06/21  Yes Barrett Henle, MD  amLODipine (NORVASC) 5 MG tablet Take 5 mg daily by mouth.    [provider]  Ascorbic Acid (VITAMIN C) 100 MG tablet Take 100 mg by mouth daily.     [provider]  benzonatate (TESSALON) 100 MG capsule Take 1 capsule (100 mg total) by mouth every 8 (eight) hours as needed for cough. 08/25/21   Teodora Medici, FNP  blood glucose meter kit and supplies KIT daily as needed by Does not apply route. Dispense based on patient and insurance preference. Use up to four times daily as directed. (FOR ICD-9 250.00, 250.01).    [provider]  calcium-vitamin D (OSCAL WITH D) 500-200 MG-UNIT tablet Take 1 tablet by mouth.    [provider]  Cyanocobalamin (VITAMIN B-12 IJ) Inject 1,000 mcg as directed every 30 (thirty) days.    [provider]  doxycycline (VIBRAMYCIN) 100 MG capsule Take 1 capsule (100 mg total) by mouth 2 (two) times daily. 03/09/20   Scot Jun, FNP  DULoxetine (CYMBALTA) 30 MG capsule TAKE 1 CAP ONCE DAILY FOR 45 DAYS, THEN 2 CAPSULES (60 MG TOTAL) ONCE DAILY FOR 45 DAYS. 08/05/21   [provider]  fluconazole (DIFLUCAN) 150 MG tablet Take 1 tablet (  150 mg total) by mouth daily. May repeat in 72 hours if needed 09/25/19   Hall-Potvin, [place], PA-C  fluticasone (FLONASE) 50 MCG/ACT nasal spray Place daily into both nostrils.    [provider]  folic acid (FOLVITE) 1 MG tablet Take 2 mg by mouth daily.     [provider]  hydroxychloroquine (PLAQUENIL) 200 MG tablet Take 200 mg by mouth daily.     [provider]  LANCETS MICRO THIN 33G MISC by Does not apply route.    [provider]  lisinopril-hydrochlorothiazide (PRINZIDE,ZESTORETIC) 20-12.5 MG tablet Take 1 tablet daily by mouth.    [provider]  loratadine (CLARITIN) 10 MG tablet Take 10 mg daily by mouth.    [provider]  Methotrexate,  PF, 25 MG/0.5ML SOAJ Inject 50 mg into the skin once a week. Tuesday    [provider]  Multiple Vitamin (MULTIVITAMIN) tablet Take 1 tablet daily by mouth.    [provider]  naproxen sodium (ALEVE) 220 MG tablet Take 220 mg by mouth 2 (two) times daily as needed.     [provider]  predniSONE (STERAPRED UNI-PAK 21 TAB) 10 MG (21) TBPK tablet Take by mouth daily. Take 6 tabs by mouth daily  for 2 days, then 5 tabs for 2 days, then 4 tabs for 2 days, then 3 tabs for 2 days, 2 tabs for 2 days, then 1 tab by mouth daily for 2 days 09/03/21   Oswaldo Conroy E, FNP  pyridOXINE (VITAMIN B-6) 100 MG tablet Take 100 mg by mouth daily.     [provider]  RYBELSUS 3 MG TABS  09/02/18   [provider]  TURMERIC PO Take 50 mg by mouth daily.    [provider]    Family History Family History  Problem Relation Age of Onset   Uterine cancer Mother    Colon polyps Father    CAD Father    Parkinsonism Father    Colon cancer Maternal Grandmother     Social History Social History   Tobacco Use   Smoking status: Never   Smokeless tobacco: Never  Substance Use Topics   Alcohol use: No   Drug use: No     Allergies   Bactrim [sulfamethoxazole-trimethoprim], Hydrocodone, Iodine, Niaspan [niacin er], Statins, and Sulfa antibiotics   Review of Systems Review of Systems   Physical Exam Triage Vital Signs ED Triage Vitals  Enc Vitals Group     BP 10/06/21 1435 (!) 147/65     Pulse Rate 10/06/21 1435 88     Resp 10/06/21 1435 19     Temp 10/06/21 1435 98.3 F (36.8 C)     Temp Source 10/06/21 1435 Oral     SpO2 10/06/21 1435 97 %     Weight --      Height --      Head Circumference --      Peak Flow --      Pain Score 10/06/21 1434 6     Pain Loc --      Pain Edu? --      Excl. in [place]? --    No data found.  Updated Vital Signs BP (!) 147/65 (BP Location: Left Arm)   Pulse 88   Temp 98.3 F (36.8 C) (Oral)   Resp 19    SpO2 97%   Visual Acuity Right Eye Distance:   Left Eye Distance:   Bilateral Distance:    Right Eye Near:  Left Eye Near:    Bilateral Near:     Physical Exam Vitals reviewed.  Constitutional:      General: She is not in acute distress.    Appearance: She is not toxic-appearing.  Musculoskeletal:     Comments: There is tenderness and swelling over the right lateral malleolus.  There is also some tenderness and swelling anterior and inferior to the lateral malleolus.  No swelling or tenderness over the dorsum of her foot or on her right medial malleolus.  Capillary refill is normal distal to this injury  Skin:    Coloration: Skin is not jaundiced or pale.  Neurological:     Mental Status: She is alert and oriented to person, place, and time.  Psychiatric:        Behavior: Behavior normal.      UC Treatments / Results  Labs (all labs ordered are listed, but only abnormal results are displayed) Labs Reviewed - No data to display  EKG   Radiology DG Ankle Complete Right  Result Date: 10/06/2021 CLINICAL DATA:  Tripped on blanket and fell yesterday. Lateral ankle pain and swelling. EXAM: RIGHT ANKLE - COMPLETE 3+ VIEW COMPARISON:  None Available. FINDINGS: A nondisplaced fracture is seen through the distal tip of the lateral malleolus. No other acute ankle fractures are identified. No evidence of dislocation. A small ossific density again seen along the dorsal aspect of the talar neck. This is consistent with an avulsion fracture fragment, but is of indeterminate age radiographically. Lateral soft tissue swelling is noted. Mild degenerative spurring is seen along the anterior aspect of the distal tibia. IMPRESSION: Nondisplaced fracture through the distal tip of the lateral malleolus. Avulsion fracture fragment along the dorsal aspect of the talar neck, which is age-indeterminate. Recommend coronal correlation point tenderness at this site. Electronically Signed   By: Marlaine Hind  M.D.   On: 10/06/2021 15:52    Procedures Procedures (including critical care time)  Medications Ordered in UC Medications  ketorolac (TORADOL) 30 MG/ML injection 30 mg (has no administration in time range)    Initial Impression / Assessment and Plan / UC Course  I have reviewed the triage vital signs and the nursing notes.  Pertinent labs & imaging results that were available during my care of the patient were reviewed by me and considered in my medical decision making (see chart for details).     X-ray shows an avulsion fracture of the tip of the lateral malleolus.  There is also a fracture off the dorsum of her talus.  She is allergic to hydrocodone, so I am going to send in some tramadol.  Boot is applied here.  She is given contact information for podiatry.  Her last renal function in care everywhere on June 14 was normal with a creatinine of 0.8 and a GFR of 69.  We are going to give her a shot of Toradol also Final Clinical Impressions(s) / UC Diagnoses   Final diagnoses:  Closed fracture of distal lateral malleolus of right fibula, initial encounter  Closed nondisplaced fracture of dome of right talus, initial encounter     Discharge Instructions      X-rays show no fracture of your right ankle, called the malleolus.  Also there is a small fracture off the top of your talus bone.  You have been given a shot of Toradol 30 mg today.       ED Prescriptions     Medication Sig Dispense Auth. Provider   traMADol (  ULTRAM) 50 MG tablet Take 1 tablet (50 mg total) by mouth every 6 (six) hours as needed. 10 tablet Windy Carina Gwenlyn Perking, MD      I have reviewed the PDMP during this encounter.   Barrett Henle, MD 10/06/21 [phone]

## 2021-10-06 NOTE — ED Triage Notes (Signed)
Pt foot got caught in blanket when getting up yesterday afternoon causing her to fall. Reports pain and swelling to right ankle esp with weight bearing.

## 2021-10-06 NOTE — Discharge Instructions (Signed)
X-rays show no fracture of your right ankle, called the malleolus.  Also there is a small fracture off the top of your talus bone.  You have been given a shot of Toradol 30 mg today.

## 2021-10-14 DIAGNOSIS — S92114A Nondisplaced fracture of neck of right talus, initial encounter for closed fracture: Secondary | ICD-10-CM | POA: Diagnosis not present

## 2021-10-14 DIAGNOSIS — E538 Deficiency of other specified B group vitamins: Secondary | ICD-10-CM | POA: Diagnosis not present

## 2021-10-14 DIAGNOSIS — S8264XA Nondisplaced fracture of lateral malleolus of right fibula, initial encounter for closed fracture: Secondary | ICD-10-CM | POA: Diagnosis not present

## 2021-10-14 DIAGNOSIS — M25571 Pain in right ankle and joints of right foot: Secondary | ICD-10-CM | POA: Diagnosis not present

## 2021-10-17 DIAGNOSIS — Z79899 Other long term (current) drug therapy: Secondary | ICD-10-CM | POA: Diagnosis not present

## 2021-10-17 DIAGNOSIS — M0609 Rheumatoid arthritis without rheumatoid factor, multiple sites: Secondary | ICD-10-CM | POA: Diagnosis not present

## 2021-10-17 DIAGNOSIS — Z8739 Personal history of other diseases of the musculoskeletal system and connective tissue: Secondary | ICD-10-CM | POA: Diagnosis not present

## 2021-10-23 DIAGNOSIS — M25671 Stiffness of right ankle, not elsewhere classified: Secondary | ICD-10-CM | POA: Diagnosis not present

## 2021-10-23 DIAGNOSIS — M25571 Pain in right ankle and joints of right foot: Secondary | ICD-10-CM | POA: Diagnosis not present

## 2021-10-28 DIAGNOSIS — M25671 Stiffness of right ankle, not elsewhere classified: Secondary | ICD-10-CM | POA: Diagnosis not present

## 2021-10-28 DIAGNOSIS — M25571 Pain in right ankle and joints of right foot: Secondary | ICD-10-CM | POA: Diagnosis not present

## 2021-10-30 DIAGNOSIS — M25671 Stiffness of right ankle, not elsewhere classified: Secondary | ICD-10-CM | POA: Diagnosis not present

## 2021-10-30 DIAGNOSIS — M25571 Pain in right ankle and joints of right foot: Secondary | ICD-10-CM | POA: Diagnosis not present

## 2021-11-04 DIAGNOSIS — M25571 Pain in right ankle and joints of right foot: Secondary | ICD-10-CM | POA: Diagnosis not present

## 2021-11-04 DIAGNOSIS — M25671 Stiffness of right ankle, not elsewhere classified: Secondary | ICD-10-CM | POA: Diagnosis not present

## 2021-11-06 DIAGNOSIS — M25671 Stiffness of right ankle, not elsewhere classified: Secondary | ICD-10-CM | POA: Diagnosis not present

## 2021-11-06 DIAGNOSIS — M25571 Pain in right ankle and joints of right foot: Secondary | ICD-10-CM | POA: Diagnosis not present

## 2021-11-11 DIAGNOSIS — M25571 Pain in right ankle and joints of right foot: Secondary | ICD-10-CM | POA: Diagnosis not present

## 2021-11-11 DIAGNOSIS — M25671 Stiffness of right ankle, not elsewhere classified: Secondary | ICD-10-CM | POA: Diagnosis not present

## 2021-11-12 DIAGNOSIS — M180 Bilateral primary osteoarthritis of first carpometacarpal joints: Secondary | ICD-10-CM | POA: Diagnosis not present

## 2021-11-13 DIAGNOSIS — M25671 Stiffness of right ankle, not elsewhere classified: Secondary | ICD-10-CM | POA: Diagnosis not present

## 2021-11-13 DIAGNOSIS — M25571 Pain in right ankle and joints of right foot: Secondary | ICD-10-CM | POA: Diagnosis not present

## 2021-11-14 DIAGNOSIS — E538 Deficiency of other specified B group vitamins: Secondary | ICD-10-CM | POA: Diagnosis not present

## 2021-11-15 DIAGNOSIS — S8264XA Nondisplaced fracture of lateral malleolus of right fibula, initial encounter for closed fracture: Secondary | ICD-10-CM | POA: Diagnosis not present

## 2021-11-15 DIAGNOSIS — S92114A Nondisplaced fracture of neck of right talus, initial encounter for closed fracture: Secondary | ICD-10-CM | POA: Diagnosis not present

## 2021-11-19 DIAGNOSIS — M25571 Pain in right ankle and joints of right foot: Secondary | ICD-10-CM | POA: Diagnosis not present

## 2021-11-19 DIAGNOSIS — C44722 Squamous cell carcinoma of skin of right lower limb, including hip: Secondary | ICD-10-CM | POA: Diagnosis not present

## 2021-11-19 DIAGNOSIS — H6121 Impacted cerumen, right ear: Secondary | ICD-10-CM | POA: Diagnosis not present

## 2021-11-19 DIAGNOSIS — H8101 Meniere's disease, right ear: Secondary | ICD-10-CM | POA: Diagnosis not present

## 2021-11-19 DIAGNOSIS — D485 Neoplasm of uncertain behavior of skin: Secondary | ICD-10-CM | POA: Diagnosis not present

## 2021-11-19 DIAGNOSIS — M25671 Stiffness of right ankle, not elsewhere classified: Secondary | ICD-10-CM | POA: Diagnosis not present

## 2021-11-19 DIAGNOSIS — Z85820 Personal history of malignant melanoma of skin: Secondary | ICD-10-CM | POA: Diagnosis not present

## 2021-11-19 DIAGNOSIS — H9041 Sensorineural hearing loss, unilateral, right ear, with unrestricted hearing on the contralateral side: Secondary | ICD-10-CM | POA: Diagnosis not present

## 2021-11-21 DIAGNOSIS — M25671 Stiffness of right ankle, not elsewhere classified: Secondary | ICD-10-CM | POA: Diagnosis not present

## 2021-11-21 DIAGNOSIS — M0609 Rheumatoid arthritis without rheumatoid factor, multiple sites: Secondary | ICD-10-CM | POA: Diagnosis not present

## 2021-11-21 DIAGNOSIS — M25571 Pain in right ankle and joints of right foot: Secondary | ICD-10-CM | POA: Diagnosis not present

## 2021-11-27 DIAGNOSIS — M25671 Stiffness of right ankle, not elsewhere classified: Secondary | ICD-10-CM | POA: Diagnosis not present

## 2021-11-27 DIAGNOSIS — M25571 Pain in right ankle and joints of right foot: Secondary | ICD-10-CM | POA: Diagnosis not present

## 2021-12-03 DIAGNOSIS — M25671 Stiffness of right ankle, not elsewhere classified: Secondary | ICD-10-CM | POA: Diagnosis not present

## 2021-12-03 DIAGNOSIS — M25571 Pain in right ankle and joints of right foot: Secondary | ICD-10-CM | POA: Diagnosis not present

## 2021-12-11 IMAGING — MG DIGITAL DIAGNOSTIC BILAT W/ TOMO W/ CAD
8 series · 8 of 24 positions shown · non-contrast
Comparison: Previous exam(s).

CLINICAL DATA: 74-year-old patient presents for annual mammogram
and follow-up of probably benign cyst in the right breast at 12
o'clock position 2 cm from the nipple.

EXAM:
DIGITAL DIAGNOSTIC BILATERAL MAMMOGRAM WITH CAD AND TOMO
ULTRASOUND RIGHT BREAST

[L MLO synth-2D]
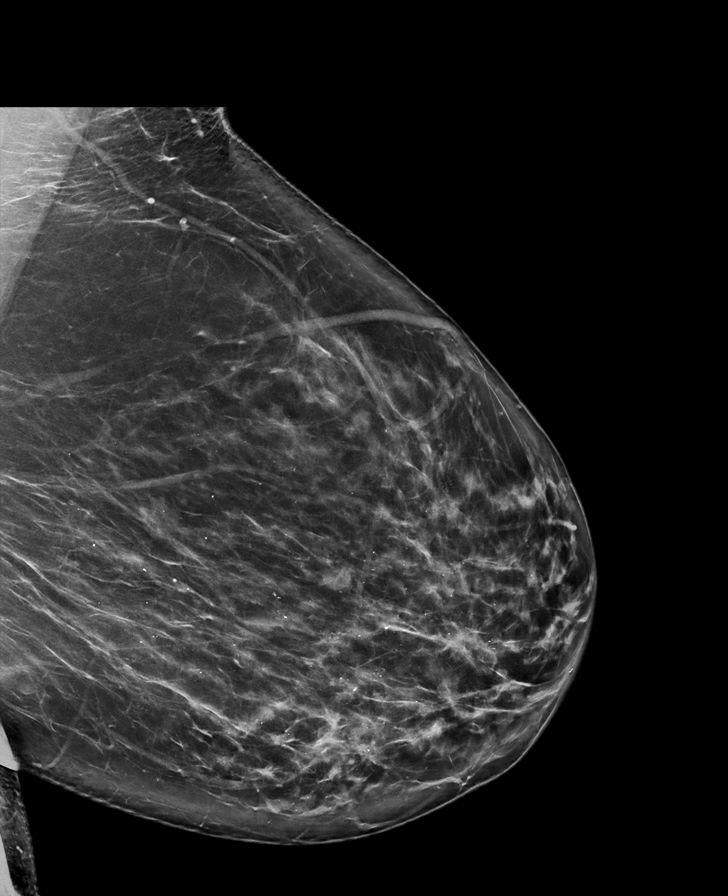

[R MLO synth-2D]
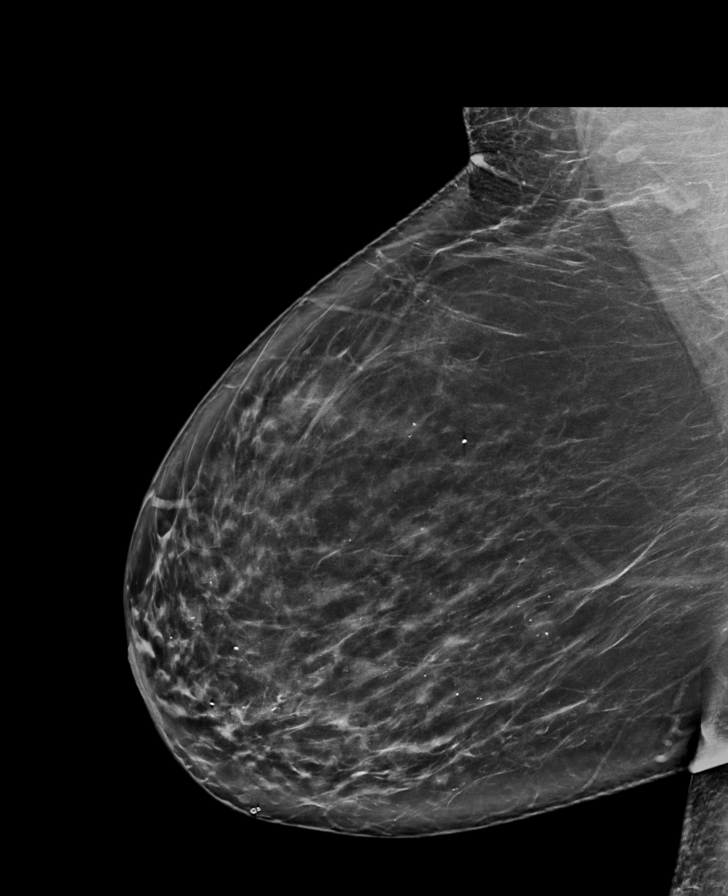

[L CC synth-2D]
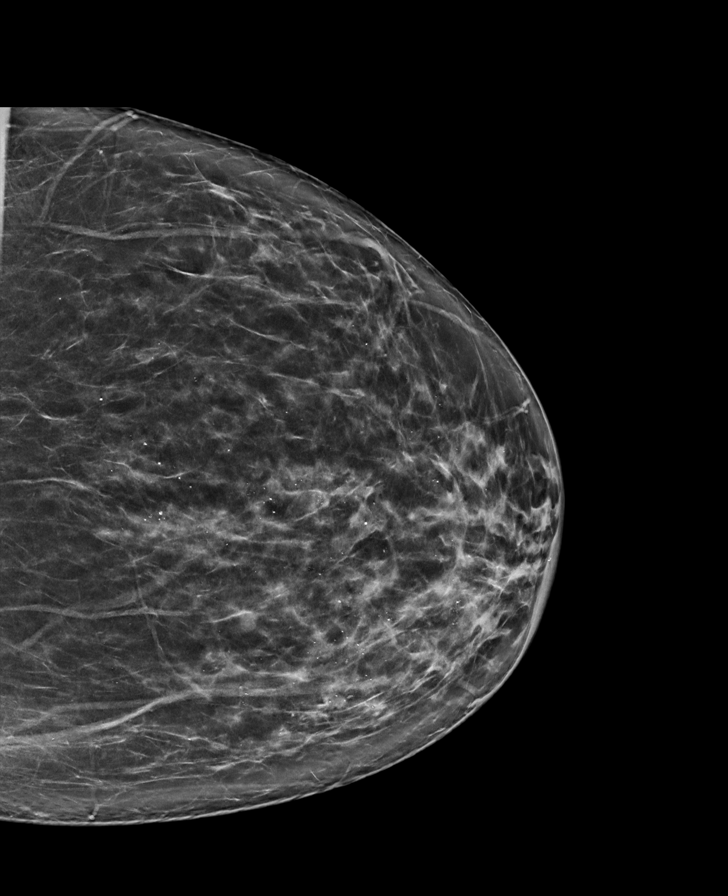

[R CC synth-2D]
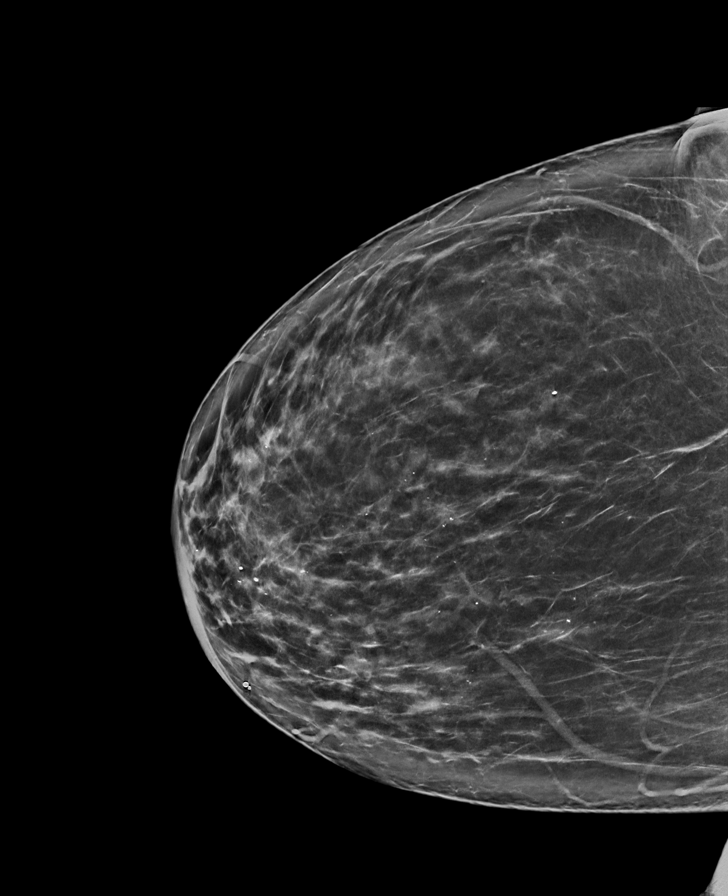

[L CC tomo · tomo slice 43/86.0]
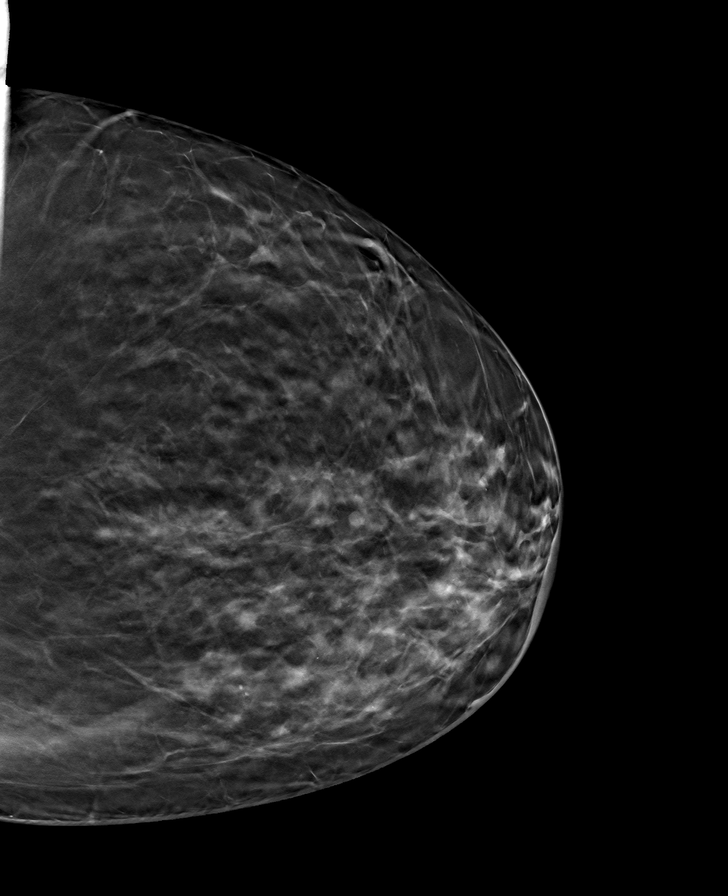

[L MLO tomo · tomo slice 48/95.0]
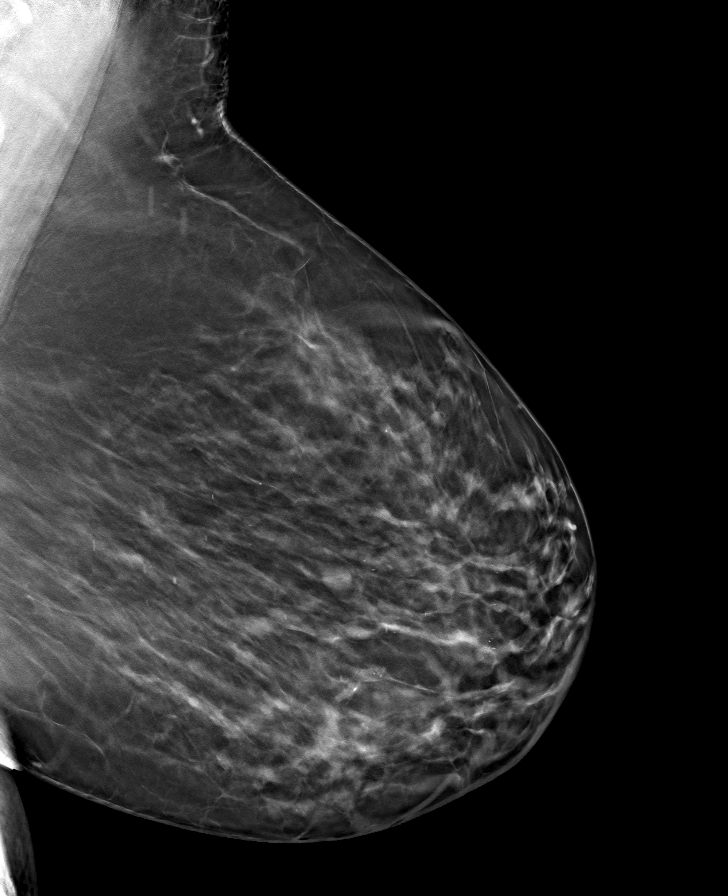

[R MLO tomo · tomo slice 49/98.0]
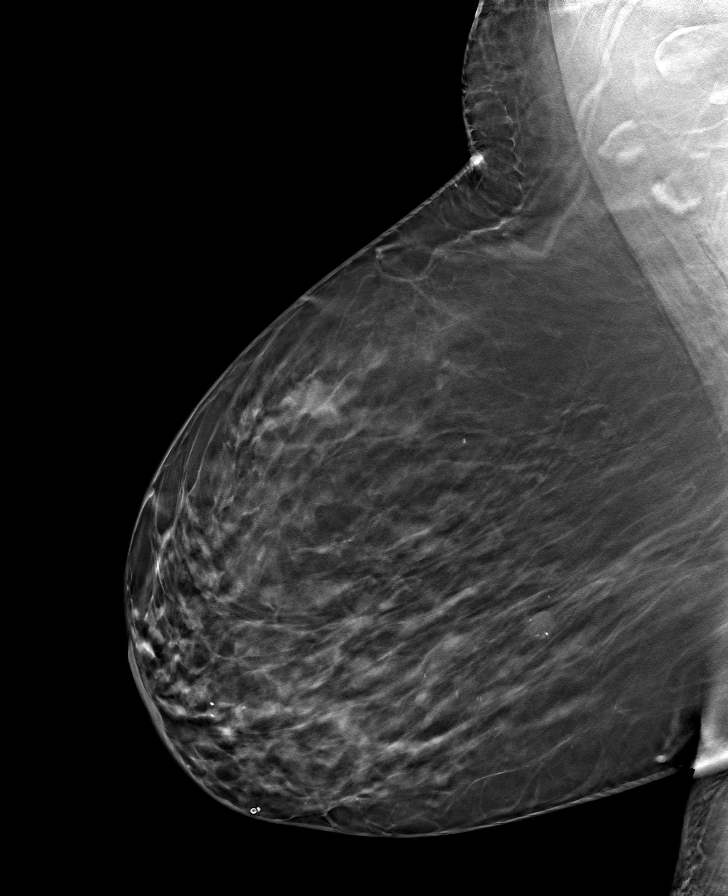

[R CC tomo · tomo slice 47/92.0]
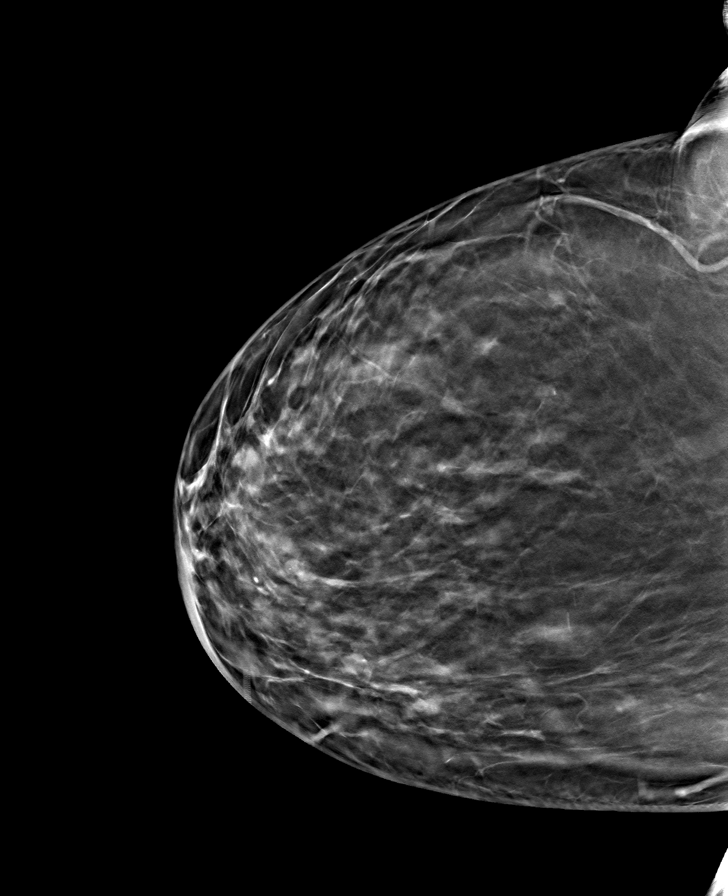

[8 of 24 positions shown; findings below may reference images not displayed]

ACR Breast Density Category c: The breast tissue is heterogeneously
dense, which may obscure small masses.
FINDINGS: No suspicious mass, suspicious microcalcification, or distortion is
identified in either breast to suggest malignancy. There are
scattered benign-appearing calcifications bilaterally.

Mammographic images were processed with CAD.

Targeted ultrasound is performed, showing a superficially positioned
cyst at 12 o'clock position 2 cm from the nipple measuring 3 x 2 x 1
mm. This has decreased in size since the prior exam in February 2019
at which time it measured 3 x 2 x 3 mm.
IMPRESSION: Interval decrease in size of the cyst at 12 o'clock position left
breast, confirming benignity. No evidence of malignancy in either
breast.

RECOMMENDATION:
Screening mammogram in one year.(Code:[id number])

I have discussed the findings and recommendations with the patient.
If applicable, a reminder letter will be sent to the patient
regarding the next appointment.

BI-RADS CATEGORY  2: Benign.

## 2021-12-17 DIAGNOSIS — H25813 Combined forms of age-related cataract, bilateral: Secondary | ICD-10-CM | POA: Diagnosis not present

## 2021-12-17 DIAGNOSIS — M13841 Other specified arthritis, right hand: Secondary | ICD-10-CM | POA: Diagnosis not present

## 2021-12-17 DIAGNOSIS — E119 Type 2 diabetes mellitus without complications: Secondary | ICD-10-CM | POA: Diagnosis not present

## 2021-12-17 DIAGNOSIS — H52203 Unspecified astigmatism, bilateral: Secondary | ICD-10-CM | POA: Diagnosis not present

## 2021-12-17 DIAGNOSIS — M13842 Other specified arthritis, left hand: Secondary | ICD-10-CM | POA: Diagnosis not present

## 2022-01-02 DIAGNOSIS — H2512 Age-related nuclear cataract, left eye: Secondary | ICD-10-CM | POA: Diagnosis not present

## 2022-01-15 DIAGNOSIS — M0609 Rheumatoid arthritis without rheumatoid factor, multiple sites: Secondary | ICD-10-CM | POA: Diagnosis not present

## 2022-04-21 DIAGNOSIS — E538 Deficiency of other specified B group vitamins: Secondary | ICD-10-CM | POA: Diagnosis not present

## 2022-05-06 DIAGNOSIS — M0609 Rheumatoid arthritis without rheumatoid factor, multiple sites: Secondary | ICD-10-CM | POA: Diagnosis not present

## 2022-05-06 DIAGNOSIS — Z79899 Other long term (current) drug therapy: Secondary | ICD-10-CM | POA: Diagnosis not present

## 2022-05-06 DIAGNOSIS — Z8739 Personal history of other diseases of the musculoskeletal system and connective tissue: Secondary | ICD-10-CM | POA: Diagnosis not present

## 2022-05-06 DIAGNOSIS — M159 Polyosteoarthritis, unspecified: Secondary | ICD-10-CM | POA: Diagnosis not present

## 2022-05-10 ENCOUNTER — Encounter: Payer: Self-pay | Admitting: Emergency Medicine

## 2022-05-10 ENCOUNTER — Ambulatory Visit
Admission: EM | Admit: 2022-05-10 | Discharge: 2022-05-10 | Disposition: A | Discharge status: Home / Self Care | Payer: HMO | Attending: Physician Assistant | Admitting: Physician Assistant

## 2022-05-10 ENCOUNTER — Other Ambulatory Visit: Payer: Self-pay

## 2022-05-10 DIAGNOSIS — Z1152 Encounter for screening for COVID-19: Secondary | ICD-10-CM | POA: Diagnosis not present

## 2022-05-10 DIAGNOSIS — J069 Acute upper respiratory infection, unspecified: Secondary | ICD-10-CM | POA: Diagnosis not present

## 2022-05-10 NOTE — ED Triage Notes (Signed)
Pt here for cough with congestion x 3 days

## 2022-05-10 NOTE — ED Provider Notes (Signed)
[name]    CSN: [id number] Arrival date & time: 05/10/22  1044      History   Chief Complaint Chief Complaint  Patient presents with   Cough    HPI Natalie Mceuen is a 77 y.o. female.   Patient here today for cough and congestion that she has had for 3 days without fever. She has not had any vomiting or diarrhea. She reports cough is not constant but is productive at times. She has taken OTC meds with mild relief.  The history is provided by the patient.  Cough Associated symptoms: sore throat   Associated symptoms: no chills, no ear pain, no eye discharge, no fever, no shortness of breath and no wheezing     Past Medical History:  Diagnosis Date   Arthritis    RA   Cancer ([place])    skin cancer   Diabetes mellitus without complication ([place])    Dyspnea    November 2009- low risk nuclear stress test, four minute, decrease exercise tolerance , echo cardiogram with mild diastolic dysfuction ,normal ,EF    Hyperlipidemia    Hypertension    OSA (obstructive sleep apnea)     Patient Active Problem List   Diagnosis Date Noted   Diabetes mellitus with stage 2 chronic kidney disease ([place]) 10/08/2013   HTN (hypertension), benign 10/08/2013    Past Surgical History:  Procedure Laterality Date   ABDOMINAL HYSTERECTOMY     APPENDECTOMY     arthroscopic rotator cuff repair     BACK SURGERY     bladder tack     COLONOSCOPY     COLONOSCOPY WITH PROPOFOL N/A 02/11/2017   Procedure: COLONOSCOPY WITH PROPOFOL;  Surgeon: Toledo, Benay Pike, MD;  Location: [facility] END[facility];  Service: Gastroenterology;  Laterality: N/A;   ESOPHAGOGASTRODUODENOSCOPY (EGD) WITH PROPOFOL N/A 02/11/2017   Procedure: ESOPHAGOGASTRODUODENOSCOPY (EGD) WITH PROPOFOL;  Surgeon: Toledo, Benay Pike, MD;  Location: [facility] ENDOSCOPY;  Service: Gastroenterology;  Laterality: N/A;   melanoma removal      OB History   No obstetric history on file.      Home Medications    Prior to  Admission medications   Medication Sig Start Date End Date Taking? Authorizing Provider  amLODipine (NORVASC) 5 MG tablet Take 5 mg daily by mouth.    [provider]  Ascorbic Acid (VITAMIN C) 100 MG tablet Take 100 mg by mouth daily.     [provider]  benzonatate (TESSALON) 100 MG capsule Take 1 capsule (100 mg total) by mouth every 8 (eight) hours as needed for cough. 08/25/21   Teodora Medici, FNP  blood glucose meter kit and supplies KIT daily as needed by Does not apply route. Dispense based on patient and insurance preference. Use up to four times daily as directed. (FOR ICD-9 250.00, 250.01).    [provider]  calcium-vitamin D (OSCAL WITH D) 500-200 MG-UNIT tablet Take 1 tablet by mouth.    [provider]  Cyanocobalamin (VITAMIN B-12 IJ) Inject 1,000 mcg as directed every 30 (thirty) days.    [provider]  doxycycline (VIBRAMYCIN) 100 MG capsule Take 1 capsule (100 mg total) by mouth 2 (two) times daily. Patient not taking: Reported on 05/10/2022 03/09/20   Scot Jun, NP  DULoxetine (CYMBALTA) 30 MG capsule TAKE 1 CAP ONCE DAILY FOR 45 DAYS, THEN 2 CAPSULES (60 MG TOTAL) ONCE DAILY FOR 45 DAYS. 08/05/21   [provider]  fluconazole (DIFLUCAN) 150 MG tablet  Take 1 tablet (150 mg total) by mouth daily. May repeat in 72 hours if needed Patient not taking: Reported on 05/10/2022 09/25/19   Hall-Potvin, [place], PA-C  fluticasone (FLONASE) 50 MCG/ACT nasal spray Place daily into both nostrils.    [provider]  folic acid (FOLVITE) 1 MG tablet Take 2 mg by mouth daily.     [provider]  hydroxychloroquine (PLAQUENIL) 200 MG tablet Take 200 mg by mouth daily.     [provider]  LANCETS MICRO THIN 33G MISC by Does not apply route.    [provider]  lisinopril-hydrochlorothiazide (PRINZIDE,ZESTORETIC) 20-12.5 MG tablet Take 1 tablet daily by mouth.    [provider]  loratadine  (CLARITIN) 10 MG tablet Take 10 mg daily by mouth.    [provider]  Methotrexate, PF, 25 MG/0.5ML SOAJ Inject 50 mg into the skin once a week. Tuesday    [provider]  Multiple Vitamin (MULTIVITAMIN) tablet Take 1 tablet daily by mouth.    [provider]  naproxen sodium (ALEVE) 220 MG tablet Take 220 mg by mouth 2 (two) times daily as needed.     [provider]  predniSONE (STERAPRED UNI-PAK 21 TAB) 10 MG (21) TBPK tablet Take by mouth daily. Take 6 tabs by mouth daily  for 2 days, then 5 tabs for 2 days, then 4 tabs for 2 days, then 3 tabs for 2 days, 2 tabs for 2 days, then 1 tab by mouth daily for 2 days Patient not taking: Reported on 05/10/2022 09/03/21   Teodora Medici, FNP  pyridOXINE (VITAMIN B-6) 100 MG tablet Take 100 mg by mouth daily.     [provider]  RYBELSUS 3 MG TABS  09/02/18   [provider]  traMADol (ULTRAM) 50 MG tablet Take 1 tablet (50 mg total) by mouth every 6 (six) hours as needed. 10/06/21   Barrett Henle, MD  TURMERIC PO Take 50 mg by mouth daily.    [provider]    Family History Family History  Problem Relation Age of Onset   Uterine cancer Mother    Colon polyps Father    CAD Father    Parkinsonism Father    Colon cancer Maternal Grandmother     Social History Social History   Tobacco Use   Smoking status: Never   Smokeless tobacco: Never  Substance Use Topics   Alcohol use: No   Drug use: No     Allergies   Bactrim [sulfamethoxazole-trimethoprim], Hydrocodone, Iodine, Niaspan [niacin er], Statins, and Sulfa antibiotics   Review of Systems Review of Systems  Constitutional:  Negative for chills and fever.  HENT:  Positive for congestion and sore throat. Negative for ear pain.   Eyes:  Negative for discharge and redness.  Respiratory:  Positive for cough. Negative for shortness of breath and wheezing.   Gastrointestinal:  Negative for abdominal pain, diarrhea, nausea  and vomiting.     Physical Exam Triage Vital Signs ED Triage Vitals  Enc Vitals Group     BP 05/10/22 1222 131/67     Pulse Rate 05/10/22 1222 93     Resp 05/10/22 1222 18     Temp 05/10/22 1222 98 F (36.7 C)     Temp Source 05/10/22 1222 Oral     SpO2 05/10/22 1222 97 %     Weight --      Height --      Head Circumference --  Peak Flow --      Pain Score 05/10/22 1223 0     Pain Loc --      Pain Edu? --      Excl. in [place]? --    No data found.  Updated Vital Signs BP 131/67 (BP Location: Left Arm)   Pulse 93   Temp 98 F (36.7 C) (Oral)   Resp 18   SpO2 97%      Physical Exam Vitals and nursing note reviewed.  Constitutional:      General: She is not in acute distress.    Appearance: Normal appearance. She is not ill-appearing.  HENT:     Head: Normocephalic and atraumatic.     Nose: Congestion present.     Mouth/Throat:     Mouth: Mucous membranes are moist.     Pharynx: No oropharyngeal exudate or posterior oropharyngeal erythema.  Eyes:     Conjunctiva/sclera: Conjunctivae normal.  Cardiovascular:     Rate and Rhythm: Normal rate and regular rhythm.     Heart sounds: Normal heart sounds. No murmur heard. Pulmonary:     Effort: Pulmonary effort is normal. No respiratory distress.     Breath sounds: Normal breath sounds. No wheezing, rhonchi or rales.  Skin:    General: Skin is warm and dry.  Neurological:     Mental Status: She is alert.  Psychiatric:        Mood and Affect: Mood normal.        Thought Content: Thought content normal.      UC Treatments / Results  Labs (all labs ordered are listed, but only abnormal results are displayed) Labs Reviewed  SARS CORONAVIRUS 2 (TAT 6-24 HRS)    EKG   Radiology No results found.  Procedures Procedures (including critical care time)  Medications Ordered in UC Medications - No data to display  Initial Impression / Assessment and Plan / UC Course  I have reviewed the triage vital signs  and the nursing notes.  Pertinent labs & imaging results that were available during my care of the patient were reviewed by me and considered in my medical decision making (see chart for details).    Suspect viral etiology of symptoms. Will order covid screen and recommended continued symptomatic treatment. Encouraged follow up if no gradual improvement or with any further concerns.   Final Clinical Impressions(s) / UC Diagnoses   Final diagnoses:  Acute upper respiratory infection  Encounter for screening for COVID-19   Discharge Instructions   None    ED Prescriptions   None    PDMP not reviewed this encounter.   Francene Finders, PA-C 05/10/22 1352

## 2022-05-11 LAB — SARS CORONAVIRUS 2 (TAT 6-24 HRS): SARS Coronavirus 2: NEGATIVE

## 2022-05-14 DIAGNOSIS — R053 Chronic cough: Secondary | ICD-10-CM | POA: Diagnosis not present

## 2022-05-19 DIAGNOSIS — H9041 Sensorineural hearing loss, unilateral, right ear, with unrestricted hearing on the contralateral side: Secondary | ICD-10-CM | POA: Diagnosis not present

## 2022-05-19 DIAGNOSIS — H8101 Meniere's disease, right ear: Secondary | ICD-10-CM | POA: Diagnosis not present

## 2022-05-29 DIAGNOSIS — E785 Hyperlipidemia, unspecified: Secondary | ICD-10-CM | POA: Diagnosis not present

## 2022-05-29 DIAGNOSIS — I10 Essential (primary) hypertension: Secondary | ICD-10-CM | POA: Diagnosis not present

## 2022-05-29 DIAGNOSIS — E1169 Type 2 diabetes mellitus with other specified complication: Secondary | ICD-10-CM | POA: Diagnosis not present

## 2022-05-29 DIAGNOSIS — N182 Chronic kidney disease, stage 2 (mild): Secondary | ICD-10-CM | POA: Diagnosis not present

## 2022-05-29 DIAGNOSIS — E1122 Type 2 diabetes mellitus with diabetic chronic kidney disease: Secondary | ICD-10-CM | POA: Diagnosis not present

## 2022-06-05 DIAGNOSIS — M791 Myalgia, unspecified site: Secondary | ICD-10-CM | POA: Diagnosis not present

## 2022-06-05 DIAGNOSIS — E1122 Type 2 diabetes mellitus with diabetic chronic kidney disease: Secondary | ICD-10-CM | POA: Diagnosis not present

## 2022-06-05 DIAGNOSIS — M0609 Rheumatoid arthritis without rheumatoid factor, multiple sites: Secondary | ICD-10-CM | POA: Diagnosis not present

## 2022-06-05 DIAGNOSIS — M79674 Pain in right toe(s): Secondary | ICD-10-CM | POA: Diagnosis not present

## 2022-06-05 DIAGNOSIS — I10 Essential (primary) hypertension: Secondary | ICD-10-CM | POA: Diagnosis not present

## 2022-06-05 DIAGNOSIS — M79675 Pain in left toe(s): Secondary | ICD-10-CM | POA: Diagnosis not present

## 2022-06-05 DIAGNOSIS — T466X5A Adverse effect of antihyperlipidemic and antiarteriosclerotic drugs, initial encounter: Secondary | ICD-10-CM | POA: Diagnosis not present

## 2022-06-05 DIAGNOSIS — E785 Hyperlipidemia, unspecified: Secondary | ICD-10-CM | POA: Diagnosis not present

## 2022-06-05 DIAGNOSIS — E1169 Type 2 diabetes mellitus with other specified complication: Secondary | ICD-10-CM | POA: Diagnosis not present

## 2022-06-05 DIAGNOSIS — N182 Chronic kidney disease, stage 2 (mild): Secondary | ICD-10-CM | POA: Diagnosis not present

## 2022-06-12 DIAGNOSIS — M13842 Other specified arthritis, left hand: Secondary | ICD-10-CM | POA: Diagnosis not present

## 2022-06-16 DIAGNOSIS — L600 Ingrowing nail: Secondary | ICD-10-CM | POA: Diagnosis not present

## 2022-06-16 DIAGNOSIS — M2042 Other hammer toe(s) (acquired), left foot: Secondary | ICD-10-CM | POA: Diagnosis not present

## 2022-06-16 DIAGNOSIS — B351 Tinea unguium: Secondary | ICD-10-CM | POA: Diagnosis not present

## 2022-06-16 DIAGNOSIS — M2041 Other hammer toe(s) (acquired), right foot: Secondary | ICD-10-CM | POA: Diagnosis not present

## 2022-06-16 DIAGNOSIS — E1140 Type 2 diabetes mellitus with diabetic neuropathy, unspecified: Secondary | ICD-10-CM | POA: Diagnosis not present

## 2022-06-16 DIAGNOSIS — M205X2 Other deformities of toe(s) (acquired), left foot: Secondary | ICD-10-CM | POA: Diagnosis not present

## 2022-06-23 DIAGNOSIS — E538 Deficiency of other specified B group vitamins: Secondary | ICD-10-CM | POA: Diagnosis not present

## 2022-07-24 DIAGNOSIS — E538 Deficiency of other specified B group vitamins: Secondary | ICD-10-CM | POA: Diagnosis not present

## 2022-07-31 DIAGNOSIS — L821 Other seborrheic keratosis: Secondary | ICD-10-CM | POA: Diagnosis not present

## 2022-07-31 DIAGNOSIS — Z85828 Personal history of other malignant neoplasm of skin: Secondary | ICD-10-CM | POA: Diagnosis not present

## 2022-07-31 DIAGNOSIS — L820 Inflamed seborrheic keratosis: Secondary | ICD-10-CM | POA: Diagnosis not present

## 2022-07-31 DIAGNOSIS — L218 Other seborrheic dermatitis: Secondary | ICD-10-CM | POA: Diagnosis not present

## 2022-07-31 DIAGNOSIS — Z85820 Personal history of malignant melanoma of skin: Secondary | ICD-10-CM | POA: Diagnosis not present

## 2022-08-04 DIAGNOSIS — M0609 Rheumatoid arthritis without rheumatoid factor, multiple sites: Secondary | ICD-10-CM | POA: Diagnosis not present

## 2022-08-04 DIAGNOSIS — E1169 Type 2 diabetes mellitus with other specified complication: Secondary | ICD-10-CM | POA: Diagnosis not present

## 2022-08-04 DIAGNOSIS — E1122 Type 2 diabetes mellitus with diabetic chronic kidney disease: Secondary | ICD-10-CM | POA: Diagnosis not present

## 2022-08-04 DIAGNOSIS — N182 Chronic kidney disease, stage 2 (mild): Secondary | ICD-10-CM | POA: Diagnosis not present

## 2022-08-04 DIAGNOSIS — I10 Essential (primary) hypertension: Secondary | ICD-10-CM | POA: Diagnosis not present

## 2022-08-04 DIAGNOSIS — M791 Myalgia, unspecified site: Secondary | ICD-10-CM | POA: Diagnosis not present

## 2022-08-04 DIAGNOSIS — E785 Hyperlipidemia, unspecified: Secondary | ICD-10-CM | POA: Diagnosis not present

## 2022-08-12 DIAGNOSIS — M159 Polyosteoarthritis, unspecified: Secondary | ICD-10-CM | POA: Diagnosis not present

## 2022-08-12 DIAGNOSIS — N63 Unspecified lump in unspecified breast: Secondary | ICD-10-CM | POA: Diagnosis not present

## 2022-08-12 DIAGNOSIS — Z8739 Personal history of other diseases of the musculoskeletal system and connective tissue: Secondary | ICD-10-CM | POA: Diagnosis not present

## 2022-08-12 DIAGNOSIS — M0609 Rheumatoid arthritis without rheumatoid factor, multiple sites: Secondary | ICD-10-CM | POA: Diagnosis not present

## 2022-08-12 DIAGNOSIS — Z79899 Other long term (current) drug therapy: Secondary | ICD-10-CM | POA: Diagnosis not present

## 2022-08-14 ENCOUNTER — Other Ambulatory Visit: Payer: Self-pay | Admitting: Internal Medicine

## 2022-08-14 DIAGNOSIS — N6320 Unspecified lump in the left breast, unspecified quadrant: Secondary | ICD-10-CM

## 2022-08-21 DIAGNOSIS — H5203 Hypermetropia, bilateral: Secondary | ICD-10-CM | POA: Diagnosis not present

## 2022-08-21 DIAGNOSIS — Z961 Presence of intraocular lens: Secondary | ICD-10-CM | POA: Diagnosis not present

## 2022-08-21 DIAGNOSIS — E119 Type 2 diabetes mellitus without complications: Secondary | ICD-10-CM | POA: Diagnosis not present

## 2022-08-25 DIAGNOSIS — E538 Deficiency of other specified B group vitamins: Secondary | ICD-10-CM | POA: Diagnosis not present

## 2022-09-04 DIAGNOSIS — I10 Essential (primary) hypertension: Secondary | ICD-10-CM | POA: Diagnosis not present

## 2022-09-04 DIAGNOSIS — M0609 Rheumatoid arthritis without rheumatoid factor, multiple sites: Secondary | ICD-10-CM | POA: Diagnosis not present

## 2022-09-04 DIAGNOSIS — E1169 Type 2 diabetes mellitus with other specified complication: Secondary | ICD-10-CM | POA: Diagnosis not present

## 2022-09-04 DIAGNOSIS — E785 Hyperlipidemia, unspecified: Secondary | ICD-10-CM | POA: Diagnosis not present

## 2022-09-04 DIAGNOSIS — E1122 Type 2 diabetes mellitus with diabetic chronic kidney disease: Secondary | ICD-10-CM | POA: Diagnosis not present

## 2022-09-04 DIAGNOSIS — N182 Chronic kidney disease, stage 2 (mild): Secondary | ICD-10-CM | POA: Diagnosis not present

## 2022-09-04 DIAGNOSIS — M791 Myalgia, unspecified site: Secondary | ICD-10-CM | POA: Diagnosis not present

## 2022-09-18 ENCOUNTER — Other Ambulatory Visit: Payer: HMO

## 2022-09-22 DIAGNOSIS — N182 Chronic kidney disease, stage 2 (mild): Secondary | ICD-10-CM | POA: Diagnosis not present

## 2022-09-22 DIAGNOSIS — E1169 Type 2 diabetes mellitus with other specified complication: Secondary | ICD-10-CM | POA: Diagnosis not present

## 2022-09-22 DIAGNOSIS — I10 Essential (primary) hypertension: Secondary | ICD-10-CM | POA: Diagnosis not present

## 2022-09-22 DIAGNOSIS — E1122 Type 2 diabetes mellitus with diabetic chronic kidney disease: Secondary | ICD-10-CM | POA: Diagnosis not present

## 2022-09-22 DIAGNOSIS — E785 Hyperlipidemia, unspecified: Secondary | ICD-10-CM | POA: Diagnosis not present

## 2022-09-23 DIAGNOSIS — M13841 Other specified arthritis, right hand: Secondary | ICD-10-CM | POA: Diagnosis not present

## 2022-09-23 DIAGNOSIS — M13842 Other specified arthritis, left hand: Secondary | ICD-10-CM | POA: Diagnosis not present

## 2022-09-23 DIAGNOSIS — G5602 Carpal tunnel syndrome, left upper limb: Secondary | ICD-10-CM | POA: Diagnosis not present

## 2022-09-24 ENCOUNTER — Ambulatory Visit
Admission: RE | Admit: 2022-09-24 | Discharge: 2022-09-24 | Disposition: A | Discharge status: Home / Self Care | Payer: PPO | Source: Ambulatory Visit | Attending: Internal Medicine | Admitting: Internal Medicine

## 2022-09-24 DIAGNOSIS — N6320 Unspecified lump in the left breast, unspecified quadrant: Secondary | ICD-10-CM

## 2022-09-24 DIAGNOSIS — N6012 Diffuse cystic mastopathy of left breast: Secondary | ICD-10-CM | POA: Diagnosis not present

## 2022-09-25 DIAGNOSIS — Z85828 Personal history of other malignant neoplasm of skin: Secondary | ICD-10-CM | POA: Diagnosis not present

## 2022-09-25 DIAGNOSIS — D692 Other nonthrombocytopenic purpura: Secondary | ICD-10-CM | POA: Diagnosis not present

## 2022-09-25 DIAGNOSIS — L814 Other melanin hyperpigmentation: Secondary | ICD-10-CM | POA: Diagnosis not present

## 2022-09-25 DIAGNOSIS — L821 Other seborrheic keratosis: Secondary | ICD-10-CM | POA: Diagnosis not present

## 2022-09-25 DIAGNOSIS — D225 Melanocytic nevi of trunk: Secondary | ICD-10-CM | POA: Diagnosis not present

## 2022-09-25 DIAGNOSIS — Z85820 Personal history of malignant melanoma of skin: Secondary | ICD-10-CM | POA: Diagnosis not present

## 2022-10-06 DIAGNOSIS — E538 Deficiency of other specified B group vitamins: Secondary | ICD-10-CM | POA: Diagnosis not present

## 2022-10-08 DIAGNOSIS — M0609 Rheumatoid arthritis without rheumatoid factor, multiple sites: Secondary | ICD-10-CM | POA: Diagnosis not present

## 2022-10-08 DIAGNOSIS — N182 Chronic kidney disease, stage 2 (mild): Secondary | ICD-10-CM | POA: Diagnosis not present

## 2022-10-08 DIAGNOSIS — M159 Polyosteoarthritis, unspecified: Secondary | ICD-10-CM | POA: Diagnosis not present

## 2022-10-08 DIAGNOSIS — F4321 Adjustment disorder with depressed mood: Secondary | ICD-10-CM | POA: Diagnosis not present

## 2022-10-08 DIAGNOSIS — T466X5A Adverse effect of antihyperlipidemic and antiarteriosclerotic drugs, initial encounter: Secondary | ICD-10-CM | POA: Diagnosis not present

## 2022-10-08 DIAGNOSIS — E1122 Type 2 diabetes mellitus with diabetic chronic kidney disease: Secondary | ICD-10-CM | POA: Diagnosis not present

## 2022-10-08 DIAGNOSIS — Z8739 Personal history of other diseases of the musculoskeletal system and connective tissue: Secondary | ICD-10-CM | POA: Diagnosis not present

## 2022-10-08 DIAGNOSIS — M791 Myalgia, unspecified site: Secondary | ICD-10-CM | POA: Diagnosis not present

## 2022-10-08 DIAGNOSIS — G629 Polyneuropathy, unspecified: Secondary | ICD-10-CM | POA: Diagnosis not present

## 2022-10-08 DIAGNOSIS — E1169 Type 2 diabetes mellitus with other specified complication: Secondary | ICD-10-CM | POA: Diagnosis not present

## 2022-10-08 DIAGNOSIS — I10 Essential (primary) hypertension: Secondary | ICD-10-CM | POA: Diagnosis not present

## 2022-10-08 DIAGNOSIS — E785 Hyperlipidemia, unspecified: Secondary | ICD-10-CM | POA: Diagnosis not present

## 2022-11-11 DIAGNOSIS — E538 Deficiency of other specified B group vitamins: Secondary | ICD-10-CM | POA: Diagnosis not present

## 2022-11-18 DIAGNOSIS — H9041 Sensorineural hearing loss, unilateral, right ear, with unrestricted hearing on the contralateral side: Secondary | ICD-10-CM | POA: Diagnosis not present

## 2022-11-18 DIAGNOSIS — R42 Dizziness and giddiness: Secondary | ICD-10-CM | POA: Diagnosis not present

## 2022-11-19 DIAGNOSIS — G8918 Other acute postprocedural pain: Secondary | ICD-10-CM | POA: Diagnosis not present

## 2022-11-19 DIAGNOSIS — M1811 Unilateral primary osteoarthritis of first carpometacarpal joint, right hand: Secondary | ICD-10-CM | POA: Diagnosis not present

## 2022-11-19 DIAGNOSIS — G5602 Carpal tunnel syndrome, left upper limb: Secondary | ICD-10-CM | POA: Diagnosis not present

## 2022-11-19 DIAGNOSIS — M180 Bilateral primary osteoarthritis of first carpometacarpal joints: Secondary | ICD-10-CM | POA: Diagnosis not present

## 2022-11-19 DIAGNOSIS — M1812 Unilateral primary osteoarthritis of first carpometacarpal joint, left hand: Secondary | ICD-10-CM | POA: Diagnosis not present

## 2022-12-04 DIAGNOSIS — M13842 Other specified arthritis, left hand: Secondary | ICD-10-CM | POA: Diagnosis not present

## 2022-12-04 DIAGNOSIS — G5602 Carpal tunnel syndrome, left upper limb: Secondary | ICD-10-CM | POA: Diagnosis not present

## 2022-12-12 DIAGNOSIS — E538 Deficiency of other specified B group vitamins: Secondary | ICD-10-CM | POA: Diagnosis not present

## 2022-12-16 DIAGNOSIS — Z79899 Other long term (current) drug therapy: Secondary | ICD-10-CM | POA: Diagnosis not present

## 2022-12-16 DIAGNOSIS — M0609 Rheumatoid arthritis without rheumatoid factor, multiple sites: Secondary | ICD-10-CM | POA: Diagnosis not present

## 2022-12-16 DIAGNOSIS — Z8739 Personal history of other diseases of the musculoskeletal system and connective tissue: Secondary | ICD-10-CM | POA: Diagnosis not present

## 2023-01-01 DIAGNOSIS — M79645 Pain in left finger(s): Secondary | ICD-10-CM | POA: Diagnosis not present

## 2023-01-01 DIAGNOSIS — M13842 Other specified arthritis, left hand: Secondary | ICD-10-CM | POA: Diagnosis not present

## 2023-01-09 DIAGNOSIS — M79645 Pain in left finger(s): Secondary | ICD-10-CM | POA: Diagnosis not present

## 2023-01-15 DIAGNOSIS — E538 Deficiency of other specified B group vitamins: Secondary | ICD-10-CM | POA: Diagnosis not present

## 2023-01-15 DIAGNOSIS — R7989 Other specified abnormal findings of blood chemistry: Secondary | ICD-10-CM | POA: Diagnosis not present

## 2023-01-19 DIAGNOSIS — M79645 Pain in left finger(s): Secondary | ICD-10-CM | POA: Diagnosis not present

## 2023-01-27 DIAGNOSIS — M79645 Pain in left finger(s): Secondary | ICD-10-CM | POA: Diagnosis not present

## 2023-01-29 DIAGNOSIS — M13842 Other specified arthritis, left hand: Secondary | ICD-10-CM | POA: Diagnosis not present

## 2023-02-02 DIAGNOSIS — E1122 Type 2 diabetes mellitus with diabetic chronic kidney disease: Secondary | ICD-10-CM | POA: Diagnosis not present

## 2023-02-02 DIAGNOSIS — I10 Essential (primary) hypertension: Secondary | ICD-10-CM | POA: Diagnosis not present

## 2023-02-02 DIAGNOSIS — E785 Hyperlipidemia, unspecified: Secondary | ICD-10-CM | POA: Diagnosis not present

## 2023-02-02 DIAGNOSIS — N182 Chronic kidney disease, stage 2 (mild): Secondary | ICD-10-CM | POA: Diagnosis not present

## 2023-02-02 DIAGNOSIS — E1169 Type 2 diabetes mellitus with other specified complication: Secondary | ICD-10-CM | POA: Diagnosis not present

## 2023-02-03 DIAGNOSIS — M79645 Pain in left finger(s): Secondary | ICD-10-CM | POA: Diagnosis not present

## 2023-02-09 DIAGNOSIS — I10 Essential (primary) hypertension: Secondary | ICD-10-CM | POA: Diagnosis not present

## 2023-02-09 DIAGNOSIS — G720 Drug-induced myopathy: Secondary | ICD-10-CM | POA: Diagnosis not present

## 2023-02-09 DIAGNOSIS — Z0000 Encounter for general adult medical examination without abnormal findings: Secondary | ICD-10-CM | POA: Diagnosis not present

## 2023-02-09 DIAGNOSIS — E1122 Type 2 diabetes mellitus with diabetic chronic kidney disease: Secondary | ICD-10-CM | POA: Diagnosis not present

## 2023-02-09 DIAGNOSIS — N182 Chronic kidney disease, stage 2 (mild): Secondary | ICD-10-CM | POA: Diagnosis not present

## 2023-02-09 DIAGNOSIS — E1169 Type 2 diabetes mellitus with other specified complication: Secondary | ICD-10-CM | POA: Diagnosis not present

## 2023-02-09 DIAGNOSIS — M0609 Rheumatoid arthritis without rheumatoid factor, multiple sites: Secondary | ICD-10-CM | POA: Diagnosis not present

## 2023-02-09 DIAGNOSIS — E785 Hyperlipidemia, unspecified: Secondary | ICD-10-CM | POA: Diagnosis not present

## 2023-02-09 DIAGNOSIS — T466X5A Adverse effect of antihyperlipidemic and antiarteriosclerotic drugs, initial encounter: Secondary | ICD-10-CM | POA: Diagnosis not present

## 2023-02-12 DIAGNOSIS — M79645 Pain in left finger(s): Secondary | ICD-10-CM | POA: Diagnosis not present

## 2023-02-27 DIAGNOSIS — M13841 Other specified arthritis, right hand: Secondary | ICD-10-CM | POA: Diagnosis not present

## 2023-02-27 DIAGNOSIS — M13842 Other specified arthritis, left hand: Secondary | ICD-10-CM | POA: Diagnosis not present

## 2023-03-16 DIAGNOSIS — E538 Deficiency of other specified B group vitamins: Secondary | ICD-10-CM | POA: Diagnosis not present

## 2023-03-26 DIAGNOSIS — M13842 Other specified arthritis, left hand: Secondary | ICD-10-CM | POA: Diagnosis not present

## 2023-04-27 DIAGNOSIS — I10 Essential (primary) hypertension: Secondary | ICD-10-CM | POA: Diagnosis not present

## 2023-04-27 DIAGNOSIS — M0609 Rheumatoid arthritis without rheumatoid factor, multiple sites: Secondary | ICD-10-CM | POA: Diagnosis not present

## 2023-04-27 DIAGNOSIS — E1122 Type 2 diabetes mellitus with diabetic chronic kidney disease: Secondary | ICD-10-CM | POA: Diagnosis not present

## 2023-04-27 DIAGNOSIS — E538 Deficiency of other specified B group vitamins: Secondary | ICD-10-CM | POA: Diagnosis not present

## 2023-04-27 DIAGNOSIS — E1169 Type 2 diabetes mellitus with other specified complication: Secondary | ICD-10-CM | POA: Diagnosis not present

## 2023-04-27 DIAGNOSIS — N182 Chronic kidney disease, stage 2 (mild): Secondary | ICD-10-CM | POA: Diagnosis not present

## 2023-04-27 DIAGNOSIS — E785 Hyperlipidemia, unspecified: Secondary | ICD-10-CM | POA: Diagnosis not present

## 2023-04-27 DIAGNOSIS — T466X5A Adverse effect of antihyperlipidemic and antiarteriosclerotic drugs, initial encounter: Secondary | ICD-10-CM | POA: Diagnosis not present

## 2023-04-27 DIAGNOSIS — G720 Drug-induced myopathy: Secondary | ICD-10-CM | POA: Diagnosis not present

## 2023-05-20 ENCOUNTER — Telehealth (INDEPENDENT_AMBULATORY_CARE_PROVIDER_SITE_OTHER): Payer: Self-pay | Admitting: Otolaryngology

## 2023-05-20 NOTE — Telephone Encounter (Signed)
Left vm to confirm appt and address for 05/21/2023.

## 2023-05-21 ENCOUNTER — Ambulatory Visit (INDEPENDENT_AMBULATORY_CARE_PROVIDER_SITE_OTHER): Payer: PPO | Admitting: Otolaryngology

## 2023-05-21 ENCOUNTER — Encounter (INDEPENDENT_AMBULATORY_CARE_PROVIDER_SITE_OTHER): Payer: Self-pay

## 2023-05-21 VITALS — BP 150/77 | HR 96 | Ht 67.0 in | Wt 198.0 lb

## 2023-05-21 DIAGNOSIS — H9311 Tinnitus, right ear: Secondary | ICD-10-CM

## 2023-05-21 DIAGNOSIS — R42 Dizziness and giddiness: Secondary | ICD-10-CM

## 2023-05-21 DIAGNOSIS — H8101 Meniere's disease, right ear: Secondary | ICD-10-CM | POA: Diagnosis not present

## 2023-05-21 DIAGNOSIS — H9041 Sensorineural hearing loss, unilateral, right ear, with unrestricted hearing on the contralateral side: Secondary | ICD-10-CM | POA: Diagnosis not present

## 2023-05-21 MED ORDER — MUPIROCIN CALCIUM 2 % EX CREA: 1.0000 | TOPICAL_CREAM | Freq: Two times a day (BID) | CUTANEOUS | 3 refills | Status: AC | PRN

## 2023-05-24 DIAGNOSIS — R42 Dizziness and giddiness: Secondary | ICD-10-CM | POA: Insufficient documentation

## 2023-05-24 DIAGNOSIS — H9041 Sensorineural hearing loss, unilateral, right ear, with unrestricted hearing on the contralateral side: Secondary | ICD-10-CM | POA: Insufficient documentation

## 2023-05-24 DIAGNOSIS — H9311 Tinnitus, right ear: Secondary | ICD-10-CM | POA: Insufficient documentation

## 2023-05-24 NOTE — Progress Notes (Signed)
Patient ID: Sharon Mitchell, female   DOB: 1945-04-24, 78 y.o.   MRN: [id number]  Follow-up: Recurrent dizziness, right ear hearing loss, right ear tinnitus  HPI: The patient is a 78 year old female who returns today for her follow-up evaluation.  The patient has a history of recurrent dizziness, right ear Mnire's disease, right ear hearing loss, and tinnitus.  The patient was treated with low-salt diet and hearing aids. Her MRI scan was negative.  The patient returns today reporting occasional dizziness.  She denies any change in her hearing or significant spinning vertigo. She is adhering to the low-salt diet.  She has also noted occasional nasal congestion and nasal sores.  She is currently using Dymista nasal sprays.  Exam: General: Communicates without difficulty, well nourished, no acute distress. Head: Normocephalic, no evidence injury, no tenderness, facial buttresses intact without stepoff. Eyes: PERRL, EOMI. No scleral icterus, conjunctivae clear. Neuro: CN II exam reveals vision grossly intact. No nystagmus at any point of gaze. Ears: Auricles well formed without lesions.  The ear canals and tympanic membranes are normal.  Nose: External evaluation reveals normal support and skin without lesions. Dorsum is intact. Anterior rhinoscopy reveals congested mucosa over anterior aspect of inferior turbinates and intact septum. No purulence noted. Oral:  Oral cavity and oropharynx are intact, symmetric, without erythema or edema. Mucosa is moist without lesions. Neck: Full range of motion without pain. There is no significant lymphadenopathy. No masses palpable. Thyroid bed within normal limits to palpation. Parotid glands and submandibular glands equal bilaterally without mass. Trachea is midline. Neuro:  CN 2-12 grossly intact. Gait wide-based. Vestibular: No nystagmus at any point of gaze. The cerebellar examination is unremarkable.   Assessment 1.  The patient's right ear Mnire's disease  is currently stable.  She has occasional mild dizziness. 2.  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 3.  Her ear canals, tympanic membranes, and middle ear spaces are normal. 4.  Her history is consistent with nasal impetigo.  Plan  1. The physical exam findings are reviewed with the patient. 2.  The pathophysiology of dizziness and Mnire's disease are discussed with the patient.  Questions are invited and answered. 3.  The patient is encouraged to continue the low-salt diet.  Other treatment options for Mnire's disease are also discussed. 4.  Continue the use of bilateral hearing aids. 5.  Bactroban ointment to treat the nasal impetigo. 6.  The patient will return for reevaluation in 6 months.

## 2023-05-29 DIAGNOSIS — M1811 Unilateral primary osteoarthritis of first carpometacarpal joint, right hand: Secondary | ICD-10-CM | POA: Diagnosis not present

## 2023-05-29 DIAGNOSIS — M1812 Unilateral primary osteoarthritis of first carpometacarpal joint, left hand: Secondary | ICD-10-CM | POA: Diagnosis not present

## 2023-05-29 DIAGNOSIS — M180 Bilateral primary osteoarthritis of first carpometacarpal joints: Secondary | ICD-10-CM | POA: Diagnosis not present

## 2023-06-02 DIAGNOSIS — E538 Deficiency of other specified B group vitamins: Secondary | ICD-10-CM | POA: Diagnosis not present

## 2023-06-08 ENCOUNTER — Telehealth (INDEPENDENT_AMBULATORY_CARE_PROVIDER_SITE_OTHER): Payer: Self-pay | Admitting: Audiology

## 2023-06-08 DIAGNOSIS — M0609 Rheumatoid arthritis without rheumatoid factor, multiple sites: Secondary | ICD-10-CM | POA: Diagnosis not present

## 2023-06-08 DIAGNOSIS — I10 Essential (primary) hypertension: Secondary | ICD-10-CM | POA: Diagnosis not present

## 2023-06-08 DIAGNOSIS — E1122 Type 2 diabetes mellitus with diabetic chronic kidney disease: Secondary | ICD-10-CM | POA: Diagnosis not present

## 2023-06-08 DIAGNOSIS — N182 Chronic kidney disease, stage 2 (mild): Secondary | ICD-10-CM | POA: Diagnosis not present

## 2023-06-08 DIAGNOSIS — E1169 Type 2 diabetes mellitus with other specified complication: Secondary | ICD-10-CM | POA: Diagnosis not present

## 2023-06-08 DIAGNOSIS — E785 Hyperlipidemia, unspecified: Secondary | ICD-10-CM | POA: Diagnosis not present

## 2023-06-08 DIAGNOSIS — G720 Drug-induced myopathy: Secondary | ICD-10-CM | POA: Diagnosis not present

## 2023-06-08 NOTE — Telephone Encounter (Signed)
 Confirmed address when appt was made

## 2023-06-08 NOTE — Telephone Encounter (Signed)
 Patient was called after I was notified she left a voice message stating she was having problems charging her hearing aid.   Called back to her home phone and cell phone. No answer. Left a voice message  offering her to drop off the device and charger. I will take a look a it in between patients and I will call her to let her know how the problem can be solved.   Also the patient was offered to call back and ask for a hearing aid check appointment with me. When I called her I had some availability today.

## 2023-06-09 ENCOUNTER — Ambulatory Visit (INDEPENDENT_AMBULATORY_CARE_PROVIDER_SITE_OTHER): Payer: Self-pay | Admitting: Audiology

## 2023-06-09 DIAGNOSIS — H903 Sensorineural hearing loss, bilateral: Secondary | ICD-10-CM

## 2023-06-09 NOTE — Progress Notes (Signed)
  [address], [address] [place], [place] [address] [phone]  Hearing Aid Check     Devlin Mcveigh [facility] comes for a scheduled appointment for a hearing aid check.     Right  Hearing aid manufacturer Phonak Lavon Paganini SN: [id number]  Hearing aid style Received in the canal  Hearing aid battery rechargeable  Receiver 63M  Dome/ custom earpiece Large double dome  Retention wire none  Warranty expiration date 04-26-2020  Loss and Damage expired  Initial fitting date 02-03-2018  Device was fit at: [facility]    Chief complaint: Patient reports the right hearing aid is having trouble charging.  Actions taken: Inspection of the device and listening check showed that it had no charge. Placed it in her charger to monitor its behavior. A loaner aid with a Financial planner was programmed using her las session on file. The loaner uses regular 312 batteries and some were provided to help her get by the funeral she is attending tomorrow. She said she would return the loaner in a week.    The patient was asked to make sure that when she pushes it in the charger she sees a yellow or red light by the end of the day. If she wiggles it in the charger and is blinking green it is possible that she unknowingly triggered it to turn on and never made contact again to charge. Clean contacts on the aid and charger. She was offered to send it in for a $200-$300 out of warranty repair fee, but she said she may prefer to bout a new one. I explained that as of right now we are not selling hearing aids at [facility]. She may want a copy of her hearing test so that she can go buy it at [facility], per her preference.   Services fee: $30 was paid at checkout.   Recommend: Return the hearing aid loaner in 1 -2 weeks.  Return for a hearing evaluation and to see an ENT, if concerns with hearing changes arise.    Marlene Pfluger MARIE LEROUX-MARTINEZ, AUD

## 2023-07-03 DIAGNOSIS — E538 Deficiency of other specified B group vitamins: Secondary | ICD-10-CM | POA: Diagnosis not present

## 2023-07-23 DIAGNOSIS — Z79899 Other long term (current) drug therapy: Secondary | ICD-10-CM | POA: Diagnosis not present

## 2023-07-23 DIAGNOSIS — M0609 Rheumatoid arthritis without rheumatoid factor, multiple sites: Secondary | ICD-10-CM | POA: Diagnosis not present

## 2023-07-23 DIAGNOSIS — M7062 Trochanteric bursitis, left hip: Secondary | ICD-10-CM | POA: Diagnosis not present

## 2023-07-23 DIAGNOSIS — M7061 Trochanteric bursitis, right hip: Secondary | ICD-10-CM | POA: Diagnosis not present

## 2023-08-03 DIAGNOSIS — E538 Deficiency of other specified B group vitamins: Secondary | ICD-10-CM | POA: Diagnosis not present

## 2023-08-10 DIAGNOSIS — N182 Chronic kidney disease, stage 2 (mild): Secondary | ICD-10-CM | POA: Diagnosis not present

## 2023-08-10 DIAGNOSIS — E1169 Type 2 diabetes mellitus with other specified complication: Secondary | ICD-10-CM | POA: Diagnosis not present

## 2023-08-10 DIAGNOSIS — E785 Hyperlipidemia, unspecified: Secondary | ICD-10-CM | POA: Diagnosis not present

## 2023-08-10 DIAGNOSIS — E1122 Type 2 diabetes mellitus with diabetic chronic kidney disease: Secondary | ICD-10-CM | POA: Diagnosis not present

## 2023-08-10 DIAGNOSIS — I10 Essential (primary) hypertension: Secondary | ICD-10-CM | POA: Diagnosis not present

## 2023-08-19 DIAGNOSIS — T466X5A Adverse effect of antihyperlipidemic and antiarteriosclerotic drugs, initial encounter: Secondary | ICD-10-CM | POA: Diagnosis not present

## 2023-08-19 DIAGNOSIS — I10 Essential (primary) hypertension: Secondary | ICD-10-CM | POA: Diagnosis not present

## 2023-08-19 DIAGNOSIS — E785 Hyperlipidemia, unspecified: Secondary | ICD-10-CM | POA: Diagnosis not present

## 2023-08-19 DIAGNOSIS — E1169 Type 2 diabetes mellitus with other specified complication: Secondary | ICD-10-CM | POA: Diagnosis not present

## 2023-08-19 DIAGNOSIS — E1122 Type 2 diabetes mellitus with diabetic chronic kidney disease: Secondary | ICD-10-CM | POA: Diagnosis not present

## 2023-08-19 DIAGNOSIS — M0609 Rheumatoid arthritis without rheumatoid factor, multiple sites: Secondary | ICD-10-CM | POA: Diagnosis not present

## 2023-08-19 DIAGNOSIS — N182 Chronic kidney disease, stage 2 (mild): Secondary | ICD-10-CM | POA: Diagnosis not present

## 2023-08-19 DIAGNOSIS — G720 Drug-induced myopathy: Secondary | ICD-10-CM | POA: Diagnosis not present

## 2023-08-26 DIAGNOSIS — Z961 Presence of intraocular lens: Secondary | ICD-10-CM | POA: Diagnosis not present

## 2023-08-26 DIAGNOSIS — E113293 Type 2 diabetes mellitus with mild nonproliferative diabetic retinopathy without macular edema, bilateral: Secondary | ICD-10-CM | POA: Diagnosis not present

## 2023-08-26 DIAGNOSIS — H524 Presbyopia: Secondary | ICD-10-CM | POA: Diagnosis not present

## 2023-08-27 DIAGNOSIS — M79675 Pain in left toe(s): Secondary | ICD-10-CM | POA: Diagnosis not present

## 2023-08-27 DIAGNOSIS — M79674 Pain in right toe(s): Secondary | ICD-10-CM | POA: Diagnosis not present

## 2023-08-27 DIAGNOSIS — M13842 Other specified arthritis, left hand: Secondary | ICD-10-CM | POA: Diagnosis not present

## 2023-08-27 DIAGNOSIS — M13841 Other specified arthritis, right hand: Secondary | ICD-10-CM | POA: Diagnosis not present

## 2023-09-03 DIAGNOSIS — E538 Deficiency of other specified B group vitamins: Secondary | ICD-10-CM | POA: Diagnosis not present

## 2023-09-24 DIAGNOSIS — M25512 Pain in left shoulder: Secondary | ICD-10-CM | POA: Diagnosis not present

## 2023-09-28 DIAGNOSIS — L821 Other seborrheic keratosis: Secondary | ICD-10-CM | POA: Diagnosis not present

## 2023-09-28 DIAGNOSIS — L905 Scar conditions and fibrosis of skin: Secondary | ICD-10-CM | POA: Diagnosis not present

## 2023-09-28 DIAGNOSIS — D2272 Melanocytic nevi of left lower limb, including hip: Secondary | ICD-10-CM | POA: Diagnosis not present

## 2023-09-28 DIAGNOSIS — L820 Inflamed seborrheic keratosis: Secondary | ICD-10-CM | POA: Diagnosis not present

## 2023-09-28 DIAGNOSIS — Z85828 Personal history of other malignant neoplasm of skin: Secondary | ICD-10-CM | POA: Diagnosis not present

## 2023-09-28 DIAGNOSIS — D2372 Other benign neoplasm of skin of left lower limb, including hip: Secondary | ICD-10-CM | POA: Diagnosis not present

## 2023-09-28 DIAGNOSIS — Z85820 Personal history of malignant melanoma of skin: Secondary | ICD-10-CM | POA: Diagnosis not present

## 2023-09-28 DIAGNOSIS — L814 Other melanin hyperpigmentation: Secondary | ICD-10-CM | POA: Diagnosis not present

## 2023-09-28 DIAGNOSIS — D2271 Melanocytic nevi of right lower limb, including hip: Secondary | ICD-10-CM | POA: Diagnosis not present

## 2023-09-28 DIAGNOSIS — D225 Melanocytic nevi of trunk: Secondary | ICD-10-CM | POA: Diagnosis not present

## 2023-10-01 ENCOUNTER — Encounter: Payer: Self-pay | Admitting: Internal Medicine

## 2023-10-01 ENCOUNTER — Other Ambulatory Visit: Payer: Self-pay | Admitting: Internal Medicine

## 2023-10-01 DIAGNOSIS — Z1231 Encounter for screening mammogram for malignant neoplasm of breast: Secondary | ICD-10-CM

## 2023-10-02 DIAGNOSIS — M7742 Metatarsalgia, left foot: Secondary | ICD-10-CM | POA: Diagnosis not present

## 2023-10-02 DIAGNOSIS — M79671 Pain in right foot: Secondary | ICD-10-CM | POA: Diagnosis not present

## 2023-10-02 DIAGNOSIS — M79672 Pain in left foot: Secondary | ICD-10-CM | POA: Diagnosis not present

## 2023-10-02 DIAGNOSIS — M2022 Hallux rigidus, left foot: Secondary | ICD-10-CM | POA: Diagnosis not present

## 2023-10-02 DIAGNOSIS — M2042 Other hammer toe(s) (acquired), left foot: Secondary | ICD-10-CM | POA: Diagnosis not present

## 2023-10-06 DIAGNOSIS — E538 Deficiency of other specified B group vitamins: Secondary | ICD-10-CM | POA: Diagnosis not present

## 2023-10-08 ENCOUNTER — Other Ambulatory Visit: Payer: Self-pay | Admitting: Internal Medicine

## 2023-10-08 DIAGNOSIS — Z1231 Encounter for screening mammogram for malignant neoplasm of breast: Secondary | ICD-10-CM

## 2023-10-15 ENCOUNTER — Other Ambulatory Visit: Payer: Self-pay | Admitting: Internal Medicine

## 2023-10-15 DIAGNOSIS — N644 Mastodynia: Secondary | ICD-10-CM

## 2023-10-19 ENCOUNTER — Ambulatory Visit
Admission: RE | Admit: 2023-10-19 | Discharge: 2023-10-19 | Disposition: A | Discharge status: Home / Self Care | Source: Ambulatory Visit | Attending: Internal Medicine | Admitting: Internal Medicine

## 2023-10-19 DIAGNOSIS — N644 Mastodynia: Secondary | ICD-10-CM

## 2023-10-19 DIAGNOSIS — N6002 Solitary cyst of left breast: Secondary | ICD-10-CM | POA: Diagnosis not present

## 2023-10-26 ENCOUNTER — Other Ambulatory Visit: Payer: Self-pay | Admitting: Orthopedic Surgery

## 2023-11-09 DIAGNOSIS — E538 Deficiency of other specified B group vitamins: Secondary | ICD-10-CM | POA: Diagnosis not present

## 2023-11-23 ENCOUNTER — Ambulatory Visit (INDEPENDENT_AMBULATORY_CARE_PROVIDER_SITE_OTHER): Payer: PPO | Admitting: Otolaryngology

## 2023-11-23 VITALS — BP 129/75 | HR 98

## 2023-11-23 DIAGNOSIS — R0981 Nasal congestion: Secondary | ICD-10-CM | POA: Diagnosis not present

## 2023-11-23 DIAGNOSIS — H9311 Tinnitus, right ear: Secondary | ICD-10-CM

## 2023-11-23 DIAGNOSIS — J310 Chronic rhinitis: Secondary | ICD-10-CM

## 2023-11-23 DIAGNOSIS — J343 Hypertrophy of nasal turbinates: Secondary | ICD-10-CM

## 2023-11-23 DIAGNOSIS — H8101 Meniere's disease, right ear: Secondary | ICD-10-CM | POA: Diagnosis not present

## 2023-11-23 DIAGNOSIS — R42 Dizziness and giddiness: Secondary | ICD-10-CM

## 2023-11-23 DIAGNOSIS — H9041 Sensorineural hearing loss, unilateral, right ear, with unrestricted hearing on the contralateral side: Secondary | ICD-10-CM | POA: Diagnosis not present

## 2023-11-23 NOTE — Progress Notes (Unsigned)
 Patient ID: Sharon Mitchell, female   DOB: Dec 02, 1945, 78 y.o.   MRN: [id number]  Follow-up: Recurrent dizziness, right ear hearing loss, right ear tinnitus, nasal congestion   HPI: The patient is an 78 year old female who returns today for follow-up evaluation.  The patient was previously seen for recurrent dizziness, right ear hearing loss, and right ear tinnitus.  She was diagnosed with right ear Mnire's disease and was treated with low-salt diet.  Her MRI scan was negative for retrocochlear lesion.  The patient returns today reporting only occasional mild dizziness.  She denies any spinning vertigo.  She denies any change in her right ear hearing loss or right ear tinnitus.  She is wearing a right ear hearing aid.  She reports a bad spring allergy season.  She has been having significant nasal congestion.  However, she denies any facial pain, fever, or visual change.  She is currently on Flonase and azelastine daily.  Exam: General: Communicates without difficulty, well nourished, no acute distress. Head: Normocephalic, no evidence injury, no tenderness, facial buttresses intact without stepoff. Eyes: PERRL, EOMI. No scleral icterus, conjunctivae clear. Neuro: CN II exam reveals vision grossly intact. No nystagmus at any point of gaze. Ears: Auricles well formed without lesions.  The ear canals and tympanic membranes are normal.  Nose: External evaluation reveals normal support and skin without lesions. Dorsum is intact. Anterior rhinoscopy reveals congested mucosa over anterior aspect of inferior turbinates and intact septum. No purulence noted. Oral:  Oral cavity and oropharynx are intact, symmetric, without erythema or edema. Mucosa is moist without lesions. Neck: Full range of motion without pain. There is no significant lymphadenopathy. No masses palpable. Thyroid  bed within normal limits to palpation. Parotid glands and submandibular glands equal bilaterally without mass. Trachea is midline.  Neuro:  CN 2-12 grossly intact. Gait wide-based. Vestibular: No nystagmus at any point of gaze. The cerebellar examination is unremarkable.    Assessment 1.  Recurrent dizziness, secondary to right ear Mnire's disease.  Her symptoms are mostly under control with a low-salt diet. 2.  Subjectively stable right ear low-frequency sensorineural hearing loss and tinnitus. 3.  Her ear canals, tympanic membranes, and middle ear spaces are normal. 4.  Chronic rhinitis with nasal mucosal congestion and bilateral inferior turbinate hypertrophy.  Plan: 1.  The physical exam findings are reviewed with the patient. 2.  The pathophysiology of dizziness and Mnire's disease are discussed with the patient.  Questions are invited and answered. 3.  The patient is encouraged to continue the low-salt diet.  Other treatment options for Mnire's disease are also discussed. 4.  Continue the use of her right ear hearing aid. 5.  Continue with Flonase and azelastine nasal sprays daily. 6.  Nasal saline irrigation is encouraged. 7.  The patient will return for reevaluation in 6 months, with repeat hearing test.

## 2023-11-24 DIAGNOSIS — M0609 Rheumatoid arthritis without rheumatoid factor, multiple sites: Secondary | ICD-10-CM | POA: Diagnosis not present

## 2023-11-24 DIAGNOSIS — J310 Chronic rhinitis: Secondary | ICD-10-CM | POA: Insufficient documentation

## 2023-11-24 DIAGNOSIS — Z8739 Personal history of other diseases of the musculoskeletal system and connective tissue: Secondary | ICD-10-CM | POA: Diagnosis not present

## 2023-11-24 DIAGNOSIS — Z79899 Other long term (current) drug therapy: Secondary | ICD-10-CM | POA: Diagnosis not present

## 2023-11-24 DIAGNOSIS — J343 Hypertrophy of nasal turbinates: Secondary | ICD-10-CM | POA: Insufficient documentation

## 2023-12-03 ENCOUNTER — Telehealth (INDEPENDENT_AMBULATORY_CARE_PROVIDER_SITE_OTHER): Payer: Self-pay

## 2023-12-03 NOTE — Telephone Encounter (Signed)
 Patient left a message stating she is having some dizziness and roaring in her ears, she stated this is different that her usual symptoms.  She stated she had checked her blood pressure and it was 133/78.  She wanted to know if she needed to be seen.  Please call her at [phone]

## 2023-12-04 ENCOUNTER — Ambulatory Visit (INDEPENDENT_AMBULATORY_CARE_PROVIDER_SITE_OTHER): Admitting: Otolaryngology

## 2023-12-04 ENCOUNTER — Encounter (INDEPENDENT_AMBULATORY_CARE_PROVIDER_SITE_OTHER): Payer: Self-pay | Admitting: Otolaryngology

## 2023-12-04 VITALS — BP 121/78 | HR 99

## 2023-12-04 DIAGNOSIS — H8101 Meniere's disease, right ear: Secondary | ICD-10-CM | POA: Diagnosis not present

## 2023-12-04 DIAGNOSIS — J343 Hypertrophy of nasal turbinates: Secondary | ICD-10-CM

## 2023-12-04 DIAGNOSIS — H9041 Sensorineural hearing loss, unilateral, right ear, with unrestricted hearing on the contralateral side: Secondary | ICD-10-CM

## 2023-12-04 DIAGNOSIS — J310 Chronic rhinitis: Secondary | ICD-10-CM

## 2023-12-04 DIAGNOSIS — R0981 Nasal congestion: Secondary | ICD-10-CM

## 2023-12-04 DIAGNOSIS — R42 Dizziness and giddiness: Secondary | ICD-10-CM

## 2023-12-04 DIAGNOSIS — H9311 Tinnitus, right ear: Secondary | ICD-10-CM

## 2023-12-04 NOTE — Progress Notes (Signed)
 Patient ID: Sharon Mitchell, female   DOB: 28-Apr-1945, 78 y.o.   MRN: [id number]  Follow-up: More recurrent dizziness, right ear hearing loss, right ear tinnitus, nasal congestion   HPI: The patient is an 78 year old female who returns today complaining of more recurrent dizziness.  The patient has a history of right ear Mnire's disease.  She was previously seen for her recurrent dizziness, right ear hearing loss, and right ear tinnitus.  She was treated with a 1500 mg low-salt diet.  At her last visit earlier this month, she reported only mild dizziness.  She was also noted to have nasal mucosal congestion and bilateral inferior turbinate hypertrophy.  She was continued on her low-salt diet.  She was instructed to continue the use of her right ear hearing aid, Flonase, and azelastine.  The patient returns today complaining of increasing dizziness over the past week.  She has also noted increasing right ear tinnitus.  Her nasal congestion has worsened.  Her dizziness include spinning vertigo.  The characteristic of her tinnitus has also changed.  She denies any otalgia or otorrhea.  Exam: General: Communicates without difficulty, well nourished, no acute distress. Head: Normocephalic, no evidence injury, no tenderness, facial buttresses intact without stepoff. Eyes: PERRL, EOMI. No scleral icterus, conjunctivae clear. Neuro: CN II exam reveals vision grossly intact. No nystagmus at any point of gaze. Ears: Auricles well formed without lesions.  The ear canals and tympanic membranes are normal.  Nose: External evaluation reveals normal support and skin without lesions. Dorsum is intact. Anterior rhinoscopy reveals congested mucosa over anterior aspect of inferior turbinates and intact septum. No purulence noted. Oral:  Oral cavity and oropharynx are intact, symmetric, without erythema or edema. Mucosa is moist without lesions. Neck: Full range of motion without pain. There is no significant  lymphadenopathy. No masses palpable. Thyroid  bed within normal limits to palpation. Parotid glands and submandibular glands equal bilaterally without mass. Trachea is midline. Neuro:  CN 2-12 grossly intact. Gait wide-based. Vestibular: No nystagmus at any point of gaze. The cerebellar examination is unremarkable.    Assessment: 1.  Recurrent dizziness, likely a result of acute exacerbation of her right ear Mnire's disease. 2.  Right ear sensorineural hearing loss and right ear tinnitus.  This is also likely a result of her Mnire's disease. 3.  Chronic rhinitis with nasal mucosal congestion and bilateral inferior turbinate hypertrophy.  Plan: 1.  The physical exam findings are reviewed with the patient. 2.  The pathophysiology and clinical course of dizziness and Mnire's disease are extensively discussed.  Questions were invited and answered. 3.  Continue with her 1500 mg low-salt diet. 4.  Prednisone  Dosepak for 6 days. 5.  Continue with Flonase nasal spray daily.  The importance of consistent daily use is discussed. 6.  Nasal saline irrigation is encouraged. 7.  The patient is encouraged to call with any questions or concerns.

## 2023-12-05 MED ORDER — PREDNISONE 10 MG (21) PO TBPK: ORAL_TABLET | ORAL | 0 refills | Status: AC

## 2023-12-10 DIAGNOSIS — G609 Hereditary and idiopathic neuropathy, unspecified: Secondary | ICD-10-CM | POA: Diagnosis not present

## 2023-12-10 DIAGNOSIS — E785 Hyperlipidemia, unspecified: Secondary | ICD-10-CM | POA: Diagnosis not present

## 2023-12-10 DIAGNOSIS — T466X5A Adverse effect of antihyperlipidemic and antiarteriosclerotic drugs, initial encounter: Secondary | ICD-10-CM | POA: Diagnosis not present

## 2023-12-10 DIAGNOSIS — N182 Chronic kidney disease, stage 2 (mild): Secondary | ICD-10-CM | POA: Diagnosis not present

## 2023-12-10 DIAGNOSIS — I10 Essential (primary) hypertension: Secondary | ICD-10-CM | POA: Diagnosis not present

## 2023-12-10 DIAGNOSIS — M0609 Rheumatoid arthritis without rheumatoid factor, multiple sites: Secondary | ICD-10-CM | POA: Diagnosis not present

## 2023-12-10 DIAGNOSIS — E538 Deficiency of other specified B group vitamins: Secondary | ICD-10-CM | POA: Diagnosis not present

## 2023-12-10 DIAGNOSIS — E1169 Type 2 diabetes mellitus with other specified complication: Secondary | ICD-10-CM | POA: Diagnosis not present

## 2023-12-10 DIAGNOSIS — E1122 Type 2 diabetes mellitus with diabetic chronic kidney disease: Secondary | ICD-10-CM | POA: Diagnosis not present

## 2023-12-10 DIAGNOSIS — G720 Drug-induced myopathy: Secondary | ICD-10-CM | POA: Diagnosis not present

## 2023-12-17 ENCOUNTER — Other Ambulatory Visit: Payer: Self-pay | Admitting: Internal Medicine

## 2023-12-17 DIAGNOSIS — M4807 Spinal stenosis, lumbosacral region: Secondary | ICD-10-CM

## 2023-12-24 ENCOUNTER — Ambulatory Visit (HOSPITAL_BASED_OUTPATIENT_CLINIC_OR_DEPARTMENT_OTHER): Admit: 2023-12-24 | Admitting: Orthopedic Surgery

## 2023-12-24 ENCOUNTER — Encounter (HOSPITAL_BASED_OUTPATIENT_CLINIC_OR_DEPARTMENT_OTHER): Payer: Self-pay

## 2023-12-24 ENCOUNTER — Ambulatory Visit
Admission: RE | Admit: 2023-12-24 | Discharge: 2023-12-24 | Disposition: A | Discharge status: Home / Self Care | Source: Ambulatory Visit | Attending: Internal Medicine | Admitting: Internal Medicine

## 2023-12-24 DIAGNOSIS — M51369 Other intervertebral disc degeneration, lumbar region without mention of lumbar back pain or lower extremity pain: Secondary | ICD-10-CM | POA: Diagnosis not present

## 2023-12-24 DIAGNOSIS — M47816 Spondylosis without myelopathy or radiculopathy, lumbar region: Secondary | ICD-10-CM | POA: Diagnosis not present

## 2023-12-24 DIAGNOSIS — M4807 Spinal stenosis, lumbosacral region: Secondary | ICD-10-CM | POA: Insufficient documentation

## 2023-12-24 DIAGNOSIS — M48061 Spinal stenosis, lumbar region without neurogenic claudication: Secondary | ICD-10-CM | POA: Diagnosis not present

## 2023-12-24 SURGERY — FUSION, JOINT, GREAT TOE
Anesthesia: General | Site: Toe | Laterality: Left

## 2023-12-28 DIAGNOSIS — J019 Acute sinusitis, unspecified: Secondary | ICD-10-CM | POA: Diagnosis not present

## 2023-12-28 DIAGNOSIS — B9689 Other specified bacterial agents as the cause of diseases classified elsewhere: Secondary | ICD-10-CM | POA: Diagnosis not present

## 2023-12-28 DIAGNOSIS — G629 Polyneuropathy, unspecified: Secondary | ICD-10-CM | POA: Diagnosis not present

## 2023-12-28 DIAGNOSIS — M4807 Spinal stenosis, lumbosacral region: Secondary | ICD-10-CM | POA: Diagnosis not present

## 2024-01-22 DIAGNOSIS — M545 Low back pain, unspecified: Secondary | ICD-10-CM | POA: Diagnosis not present

## 2024-01-22 DIAGNOSIS — M79605 Pain in left leg: Secondary | ICD-10-CM | POA: Diagnosis not present

## 2024-01-22 DIAGNOSIS — M79604 Pain in right leg: Secondary | ICD-10-CM | POA: Diagnosis not present

## 2024-01-22 DIAGNOSIS — M48062 Spinal stenosis, lumbar region with neurogenic claudication: Secondary | ICD-10-CM | POA: Diagnosis not present

## 2024-02-03 DIAGNOSIS — M5416 Radiculopathy, lumbar region: Secondary | ICD-10-CM | POA: Diagnosis not present

## 2024-02-05 DIAGNOSIS — E785 Hyperlipidemia, unspecified: Secondary | ICD-10-CM | POA: Diagnosis not present

## 2024-02-05 DIAGNOSIS — E1169 Type 2 diabetes mellitus with other specified complication: Secondary | ICD-10-CM | POA: Diagnosis not present

## 2024-02-05 DIAGNOSIS — I10 Essential (primary) hypertension: Secondary | ICD-10-CM | POA: Diagnosis not present

## 2024-02-05 DIAGNOSIS — N182 Chronic kidney disease, stage 2 (mild): Secondary | ICD-10-CM | POA: Diagnosis not present

## 2024-02-05 DIAGNOSIS — E1122 Type 2 diabetes mellitus with diabetic chronic kidney disease: Secondary | ICD-10-CM | POA: Diagnosis not present

## 2024-02-08 DIAGNOSIS — R053 Chronic cough: Secondary | ICD-10-CM | POA: Diagnosis not present

## 2024-02-16 DIAGNOSIS — E1122 Type 2 diabetes mellitus with diabetic chronic kidney disease: Secondary | ICD-10-CM | POA: Diagnosis not present

## 2024-02-16 DIAGNOSIS — G720 Drug-induced myopathy: Secondary | ICD-10-CM | POA: Diagnosis not present

## 2024-02-16 DIAGNOSIS — M0609 Rheumatoid arthritis without rheumatoid factor, multiple sites: Secondary | ICD-10-CM | POA: Diagnosis not present

## 2024-02-16 DIAGNOSIS — I10 Essential (primary) hypertension: Secondary | ICD-10-CM | POA: Diagnosis not present

## 2024-02-16 DIAGNOSIS — N182 Chronic kidney disease, stage 2 (mild): Secondary | ICD-10-CM | POA: Diagnosis not present

## 2024-02-16 DIAGNOSIS — R053 Chronic cough: Secondary | ICD-10-CM | POA: Diagnosis not present

## 2024-02-16 DIAGNOSIS — Z1331 Encounter for screening for depression: Secondary | ICD-10-CM | POA: Diagnosis not present

## 2024-02-16 DIAGNOSIS — E785 Hyperlipidemia, unspecified: Secondary | ICD-10-CM | POA: Diagnosis not present

## 2024-02-16 DIAGNOSIS — E1169 Type 2 diabetes mellitus with other specified complication: Secondary | ICD-10-CM | POA: Diagnosis not present

## 2024-02-16 DIAGNOSIS — T466X5A Adverse effect of antihyperlipidemic and antiarteriosclerotic drugs, initial encounter: Secondary | ICD-10-CM | POA: Diagnosis not present

## 2024-02-16 DIAGNOSIS — Z0000 Encounter for general adult medical examination without abnormal findings: Secondary | ICD-10-CM | POA: Diagnosis not present

## 2024-02-16 DIAGNOSIS — E538 Deficiency of other specified B group vitamins: Secondary | ICD-10-CM | POA: Diagnosis not present

## 2024-02-22 DIAGNOSIS — M542 Cervicalgia: Secondary | ICD-10-CM | POA: Diagnosis not present

## 2024-02-22 DIAGNOSIS — M546 Pain in thoracic spine: Secondary | ICD-10-CM | POA: Diagnosis not present

## 2024-02-22 DIAGNOSIS — R079 Chest pain, unspecified: Secondary | ICD-10-CM | POA: Diagnosis not present

## 2024-02-24 DIAGNOSIS — Z8739 Personal history of other diseases of the musculoskeletal system and connective tissue: Secondary | ICD-10-CM | POA: Diagnosis not present

## 2024-02-24 DIAGNOSIS — Z79899 Other long term (current) drug therapy: Secondary | ICD-10-CM | POA: Diagnosis not present

## 2024-02-24 DIAGNOSIS — M0609 Rheumatoid arthritis without rheumatoid factor, multiple sites: Secondary | ICD-10-CM | POA: Diagnosis not present

## 2024-02-24 DIAGNOSIS — Z8616 Personal history of COVID-19: Secondary | ICD-10-CM | POA: Diagnosis not present

## 2024-02-24 DIAGNOSIS — R4189 Other symptoms and signs involving cognitive functions and awareness: Secondary | ICD-10-CM | POA: Diagnosis not present

## 2024-03-07 DIAGNOSIS — M5417 Radiculopathy, lumbosacral region: Secondary | ICD-10-CM | POA: Diagnosis not present

## 2024-03-12 DIAGNOSIS — M542 Cervicalgia: Secondary | ICD-10-CM | POA: Diagnosis not present

## 2024-03-12 DIAGNOSIS — M5414 Radiculopathy, thoracic region: Secondary | ICD-10-CM | POA: Diagnosis not present

## 2024-05-23 ENCOUNTER — Ambulatory Visit (INDEPENDENT_AMBULATORY_CARE_PROVIDER_SITE_OTHER): Admitting: Audiology

## 2024-05-23 ENCOUNTER — Ambulatory Visit (INDEPENDENT_AMBULATORY_CARE_PROVIDER_SITE_OTHER): Admitting: Otolaryngology
